# Patient Record
Sex: Female | Born: 1955 | Race: White | Hispanic: No | Marital: Married | Smoking: Former smoker
Health system: Southern US, Community
[De-identification: ages and names within clinical notes are randomized; demographics above are authoritative.]

## PROBLEM LIST (undated history)

## (undated) DIAGNOSIS — E785 Hyperlipidemia, unspecified: Secondary | ICD-10-CM

## (undated) DIAGNOSIS — Z87440 Personal history of urinary (tract) infections: Secondary | ICD-10-CM

## (undated) DIAGNOSIS — R011 Cardiac murmur, unspecified: Secondary | ICD-10-CM

## (undated) HISTORY — DX: Personal history of urinary (tract) infections: Z87.440

## (undated) HISTORY — DX: Hyperlipidemia, unspecified: E78.5

## (undated) HISTORY — DX: Cardiac murmur, unspecified: R01.1

---

## 2011-01-16 HISTORY — PX: CHOLECYSTECTOMY: SHX55

## 2011-09-19 ENCOUNTER — Ambulatory Visit: Payer: Self-pay

## 2011-10-11 ENCOUNTER — Ambulatory Visit: Payer: Self-pay | Admitting: Family Medicine

## 2011-10-22 ENCOUNTER — Ambulatory Visit: Payer: Self-pay | Admitting: Family Medicine

## 2011-11-19 ENCOUNTER — Ambulatory Visit: Payer: Self-pay | Admitting: Surgery

## 2011-11-19 DIAGNOSIS — R011 Cardiac murmur, unspecified: Secondary | ICD-10-CM

## 2011-11-22 ENCOUNTER — Ambulatory Visit: Payer: Self-pay | Admitting: Surgery

## 2012-01-16 HISTORY — PX: BREAST BIOPSY: SHX20

## 2012-04-03 ENCOUNTER — Ambulatory Visit: Payer: Self-pay

## 2012-04-23 ENCOUNTER — Ambulatory Visit (INDEPENDENT_AMBULATORY_CARE_PROVIDER_SITE_OTHER): Payer: PRIVATE HEALTH INSURANCE | Admitting: General Surgery

## 2012-04-23 ENCOUNTER — Encounter: Payer: Self-pay | Admitting: General Surgery

## 2012-04-23 VITALS — BP 124/62 | HR 88 | Resp 14 | Ht 60.0 in | Wt 105.0 lb

## 2012-04-23 DIAGNOSIS — R920 Mammographic microcalcification found on diagnostic imaging of breast: Secondary | ICD-10-CM

## 2012-04-23 NOTE — Patient Instructions (Addendum)
The stereotactic procedure was reviewed with the patient. The potential for bleeding, infection and pain was reviewed. At this time, the benefits outweigh the risk, and the patient is amenable to proceed.  Patient will be contacted to arrange left breast stereotactic biopsy.

## 2012-04-23 NOTE — Progress Notes (Signed)
Patient ID: Samantha Diaz, female   DOB: Jul 23, 1955, 57 y.o.   MRN: [id number]  Chief Complaint  Patient presents with  . Other    f/u mammo     HPI Samantha Diaz is a 57 y.o. female here today following up from an abnormal mammogram left breast done March 2014 previous mammogram was September 2013.  Referred by Dr Patton Salles. Patient denies any breast issues.  Denies any recent breast trauma. HPI  Past Medical History  Diagnosis Date  . Murmur     Past Surgical History  Procedure Laterality Date  . Cholecystectomy  2013    No family history on file.  Social History History  Substance Use Topics  . Smoking status: Former Smoker    Quit date: 01/16/2004  . Smokeless tobacco: Never Used  . Alcohol Use: No    Allergies  Allergen Reactions  . Sulfa Antibiotics Swelling    Current Outpatient Prescriptions  Medication Sig Dispense Refill  . fish oil-omega-3 fatty acids 1000 MG capsule Take 2 g by mouth daily.      . Multiple Vitamins-Calcium (VIACTIV MULTI-VITAMIN) CHEW Chew by mouth.      . Multiple Vitamins-Minerals (ECHINACEA ACZ PO) Take by mouth.       No current facility-administered medications for this visit.    Review of Systems Review of Systems  Constitutional: Negative.   Respiratory: Negative.   Cardiovascular: Negative.     Blood pressure 124/62, pulse 88, resp. rate 14, height 5' (1.524 m), weight 105 lb (47.628 kg).  Physical Exam Physical Exam  Constitutional: She is oriented to person, place, and time. She appears well-developed and well-nourished.  Cardiovascular: Normal rate and regular rhythm.   Murmur heard.  Systolic murmur is present with a grade of 2/6  Pulmonary/Chest: Effort normal and breath sounds normal. Right breast exhibits no inverted nipple, no mass, no nipple discharge, no skin change and no tenderness. Left breast exhibits no inverted nipple, no mass, no nipple discharge, no skin change and no tenderness. Breasts are  asymmetrical.  Lymphadenopathy:    She has no cervical adenopathy.    She has no axillary adenopathy.  Neurological: She is alert and oriented to person, place, and time.  Skin: Skin is warm and dry.  Right breast > left breast, approximate 1 cup size  Data Reviewed The patient originally underwent screening mammograms at [facility]. Supplemental images of the left breast were completed at [facility] on 09/19/2011. Faint microcalcifications were identified, possibly related to milk of calcium. Followup examination completed 04/03/2012 showed indeterminate calcifications in the medial aspect of the breast at the 9:00 position, better defined and past exams. Ultrasound was again completed because of hypoechoic nodules identified in the past. Multiple prominent ductal structures were identified. A 5 mm cystic lesion is noted the 5:00 position. BIRAD-4.   Assessment    Abnormal left breast mammogram.    Plan    The patient has undergone a 6 month follow up with increasingly prominent microcalcifications in the left breast. Stereotactic biopsy has been recommended to confirm a benign process. The procedure was reviewed in detail to             Samantha Diaz 04/24/2012, 4:33 PM

## 2012-04-24 ENCOUNTER — Encounter: Payer: Self-pay | Admitting: General Surgery

## 2012-04-25 ENCOUNTER — Telehealth: Payer: Self-pay | Admitting: *Deleted

## 2012-04-25 NOTE — Telephone Encounter (Signed)
Patient was contacted today to schedule a left breast stereotactic biopsy. This has been arranged for 05-05-12 at 2 pm. She is aware of date, time, and instructions.

## 2012-05-05 ENCOUNTER — Encounter: Payer: Self-pay | Admitting: *Deleted

## 2012-05-05 ENCOUNTER — Ambulatory Visit: Payer: Self-pay | Admitting: General Surgery

## 2012-05-05 DIAGNOSIS — R920 Mammographic microcalcification found on diagnostic imaging of breast: Secondary | ICD-10-CM

## 2012-05-07 ENCOUNTER — Encounter: Payer: Self-pay | Admitting: General Surgery

## 2012-05-07 ENCOUNTER — Telehealth: Payer: Self-pay | Admitting: *Deleted

## 2012-05-07 NOTE — Telephone Encounter (Signed)
Notified patient as instructed, patient pleased. Discussed follow-up appointments, patient agrees  

## 2012-05-07 NOTE — Telephone Encounter (Signed)
Message copied by Currie Paris on Wed May 07, 2012  8:32 AM ------      Message from: [place], Merrily Pew      Created: Tue May 06, 2012 10:35 PM       Mindi Junker: Please notify the patient the biopsy was benign.       Michelle: Arrange for f/u left mammogram and OV in six months.       Thanks. ------

## 2012-05-12 ENCOUNTER — Ambulatory Visit (INDEPENDENT_AMBULATORY_CARE_PROVIDER_SITE_OTHER): Payer: PRIVATE HEALTH INSURANCE | Admitting: *Deleted

## 2012-05-12 DIAGNOSIS — R920 Mammographic microcalcification found on diagnostic imaging of breast: Secondary | ICD-10-CM

## 2012-05-12 NOTE — Progress Notes (Signed)
Patient here today for follow up post left  breast stereotatic biopsy . Minimal bruising .  The patient is aware that a heating pad may be used for comfort as needed.  Aware of pathology. Follow up as scheduled.

## 2012-05-12 NOTE — Patient Instructions (Addendum)
Patient to return in 6 months.

## 2012-05-13 ENCOUNTER — Other Ambulatory Visit: Payer: Self-pay | Admitting: *Deleted

## 2012-05-13 DIAGNOSIS — R920 Mammographic microcalcification found on diagnostic imaging of breast: Secondary | ICD-10-CM

## 2012-05-13 NOTE — Progress Notes (Signed)
The patient has been asked to return to the office in six months for a unilateral left breast diagnostic mammogram.  

## 2012-05-28 ENCOUNTER — Encounter: Payer: Self-pay | Admitting: General Surgery

## 2012-10-10 ENCOUNTER — Ambulatory Visit: Payer: Self-pay | Admitting: General Surgery

## 2012-10-13 ENCOUNTER — Encounter: Payer: Self-pay | Admitting: General Surgery

## 2012-10-22 ENCOUNTER — Ambulatory Visit (INDEPENDENT_AMBULATORY_CARE_PROVIDER_SITE_OTHER): Payer: PRIVATE HEALTH INSURANCE | Admitting: General Surgery

## 2012-10-22 ENCOUNTER — Encounter: Payer: Self-pay | Admitting: General Surgery

## 2012-10-22 VITALS — BP 110/62 | HR 76 | Resp 14 | Ht 60.0 in | Wt 103.0 lb

## 2012-10-22 DIAGNOSIS — N6019 Diffuse cystic mastopathy of unspecified breast: Secondary | ICD-10-CM

## 2012-10-22 DIAGNOSIS — R920 Mammographic microcalcification found on diagnostic imaging of breast: Secondary | ICD-10-CM

## 2012-10-22 NOTE — Patient Instructions (Signed)
Patient to return as needed. 

## 2012-10-22 NOTE — Progress Notes (Addendum)
Patient ID: Samantha Diaz, female   DOB: 1955-06-25, 57 y.o.   MRN: [id number]  Chief Complaint  Patient presents with  . Follow-up    mammogram    HPI Samantha Diaz is a 57 y.o. female. who presents for a breast evaluation.   She underwent a stereotactic biopsy for microcalcifications on 05/05/2012. This showed no evidence of atypia or malignancy. Fibrocystic changes with duct ectasia including rupture as well as columnar cell changes with microcalcifications were noted.   The most recent mammogram was done on 10/10/12. Patient does perform regular self breast checks and gets regular mammograms done.  The patient denies any problems with her breasts at this time.    HPI  Past Medical History  Diagnosis Date  . Murmur     Past Surgical History  Procedure Laterality Date  . Cholecystectomy  2013    Family History  Problem Relation Age of Onset  . Cancer Father     skin    Social History History  Substance Use Topics  . Smoking status: Former Smoker    Quit date: 01/16/2004  . Smokeless tobacco: Never Used  . Alcohol Use: No    Allergies  Allergen Reactions  . Sulfa Antibiotics Swelling  . Latex Rash    Current Outpatient Prescriptions  Medication Sig Dispense Refill  . Cholecalciferol (VITAMIN D) 2000 UNITS tablet Take 2,000 Units by mouth daily.      . fish oil-omega-3 fatty acids 1000 MG capsule Take 2 g by mouth daily.      . Multiple Vitamins-Calcium (VIACTIV MULTI-VITAMIN) CHEW Chew by mouth.      . Multiple Vitamins-Minerals (ECHINACEA ACZ PO) Take by mouth.       No current facility-administered medications for this visit.    Review of Systems Review of Systems  Constitutional: Negative.   Respiratory: Negative.   Cardiovascular: Negative.     Blood pressure 110/62, pulse 76, resp. rate 14, height 5' (1.524 m), weight 103 lb (46.72 kg).  Physical Exam Physical Exam  Constitutional: She is oriented to person, place, and time. She appears  well-developed and well-nourished.  Neck: No thyromegaly present.  Cardiovascular: Normal rate, regular rhythm and normal heart sounds.   No murmur (While the patient has a history of a heart murmur, none is appreciated on today's exam.) heard. Pulmonary/Chest: Effort normal and breath sounds normal. Right breast exhibits no inverted nipple, no mass, no nipple discharge, no skin change and no tenderness. Left breast exhibits no inverted nipple, no mass, no nipple discharge, no skin change and no tenderness.  Lymphadenopathy:    She has no cervical adenopathy.    She has no axillary adenopathy.  Neurological: She is alert and oriented to person, place, and time.  Skin: Skin is warm and dry.    Data Reviewed Bilateral mammograms is 10/10/2012 were reviewed. Normal exam. BI-RAD-1.  Biopsy dated 05/05/2012 for microcalcifications was benign.  Assessment    Benign breast exam.    Plan    The patient will resume annual screening mammograms with her primary care provider in fall 2015.  She's encouraged to call should she appreciate any changes on her own breast exam.       Samantha Diaz 11/11/2012, 2:55 PM

## 2012-10-23 ENCOUNTER — Encounter: Payer: Self-pay | Admitting: General Surgery

## 2012-10-23 DIAGNOSIS — R920 Mammographic microcalcification found on diagnostic imaging of breast: Secondary | ICD-10-CM | POA: Insufficient documentation

## 2012-11-20 ENCOUNTER — Other Ambulatory Visit: Payer: Self-pay

## 2013-10-12 ENCOUNTER — Ambulatory Visit: Payer: Self-pay

## 2013-11-16 ENCOUNTER — Encounter: Payer: Self-pay | Admitting: General Surgery

## 2014-05-04 NOTE — Op Note (Signed)
PATIENT NAME:  Samantha Diaz, Samantha Diaz MR#:  [id number] DATE OF BIRTH:  [birth year]  DATE OF PROCEDURE:  11/22/2011  PREOPERATIVE DIAGNOSIS: Symptomatic cholelithiasis.  POSTOPERATIVE DIAGNOSIS:  Symptomatic cholelithiasis.  PROCEDURE PERFORMED: Laparoscopic cholecystectomy.   SURGEON: Samaa Ueda A. Danaye Sobh, M.D.   ANESTHESIA: General.   ESTIMATED BLOOD LOSS: 10 mL.  INDICATION FOR SURGERY: [name] is a pleasant 59 year old female with recurrent history of epigastric and right upper quadrant pain with fatty foods. She had gallstones on ultrasound. We brought her to the operating room for laparoscopic cholecystectomy.   DETAILS OF PROCEDURE: The patient was brought to the operating room suite. She was laid supine on the operating room table.  She was induced, endotracheal tube was placed and general anesthesia was administered. Her abdomen was prepped and draped in standard surgical fashion. A time out was then performed correctly identifying patient name, operative site, and procedure to be performed. A supraumbilical incision was made. This was deepened down to the fascia. The fascia was grasped. The fascia was incised. A finger was placed inside the fascial incision and there were no adhesions to the peritoneum. Two 0 Vicryl stay sutures were placed through the fascia. A Hassan trocar was placed into the abdomen. The abdomen was insufflated. Camera was placed into the abdomen. The gallbladder was visualized. An epigastric 11 mm trocar and two 5 mm trocars were then placed in the anterior axillary and midclavicular lines. The gallbladder was grasped with the lateral most trocar and reflected over the dome of the liver. The cystic duct and cystic artery were then dissected out. The critical view was obtained. Three clips were placed across the cystic duct and cystic artery. These were ligated. The gallbladder was then taken off the gallbladder fossa using hook cautery. The gallbladder was then taken  out through the supraumbilical port with an Endo Catch bag. The liver bed was then examined and made hemostatic using electrocautery. The abdomen was then irrigated. There was no obvious bleeding. The trocar sites were then closed. 0 Vicryl suture was used to close the fascia of the supraumbilical site. Port site skin was then closed using a combination of 4-0 Monocryl running subcuticular and deep dermal inverted sutures. Steri-Strips, Telfa gauze, and Tegaderm was then used to complete the dressing. The patient was then awoken, extubated, and brought to the postanesthesia care unit. There were no immediate complications. Needle, sponge, and instrument counts were correct at the end of the procedure.  ____________________________ Glena Norfolk. Larosa Rhines, MD cal:slb D: 11/22/2011 09:05:45 ET T: 11/22/2011 11:42:04 ET JOB#: [id number]  cc: Harrell Gave A. Annebelle Bostic, MD, <Dictator> Floyde Parkins MD ELECTRONICALLY SIGNED 11/25/2011 21:26

## 2014-08-31 ENCOUNTER — Encounter: Payer: Self-pay | Admitting: Primary Care

## 2014-08-31 ENCOUNTER — Ambulatory Visit (INDEPENDENT_AMBULATORY_CARE_PROVIDER_SITE_OTHER): Payer: PRIVATE HEALTH INSURANCE | Admitting: Primary Care

## 2014-08-31 VITALS — BP 116/76 | HR 71 | Temp 98.2°F | Ht 60.0 in | Wt 114.2 lb

## 2014-08-31 DIAGNOSIS — E7800 Pure hypercholesterolemia, unspecified: Secondary | ICD-10-CM | POA: Insufficient documentation

## 2014-08-31 DIAGNOSIS — R011 Cardiac murmur, unspecified: Secondary | ICD-10-CM

## 2014-08-31 MED ORDER — SIMVASTATIN 20 MG PO TABS: 20.0000 mg | ORAL_TABLET | Freq: Every day | ORAL | Status: DC

## 2014-08-31 NOTE — Assessment & Plan Note (Signed)
Endorses history of. No murmur noted on exam today. Asymptomatic Will continue to monitor

## 2014-08-31 NOTE — Assessment & Plan Note (Signed)
Diagnosed 3 years ago and is managed on Simvastatin. Fair diet, does not currently exercise. Refills of simvastatin provided today. Will recheck lipids at upcoming physical

## 2014-08-31 NOTE — Progress Notes (Signed)
Pre visit review using our clinic review tool, if applicable. No additional management support is needed unless otherwise documented below in the visit note. 

## 2014-08-31 NOTE — Patient Instructions (Addendum)
Refills have been sent in for your Simvastatin.  Please schedule a physical with me in the next 3 months. You will also schedule a lab only appointment one week prior. We will discuss your lab results during your physical. We will also do your pap that day.  It was a pleasure to meet you today! Please don't hesitate to call me with any questions. Welcome to [facility]!

## 2014-08-31 NOTE — Progress Notes (Signed)
Subjective:    Patient ID: Samantha Diaz, female    DOB: 09-25-1955, 59 y.o.   MRN: [id number]  HPI  Samantha Diaz is a 59 year old female who presents today to establish care and discuss the problems mentioned below. Will obtain old records.  1) Hyperlipidemia: Diagnosed in 2013. She is managed on simvastatin 20 mg, coenzyme q10. Last cholesterol check was in 2016 at OB/GYN office.  Endorses a fair diet which consists of: Breakfast: Belvita biscuit cookies, occasional doughnuts, coffee with cream and stevia. Lunch: Takes lunch. [place] wrap with cheese, carrots, mint cookies, goldfish Dinner: Limited fried foods. Red meat, chicken, working to incorporate more fish, rice, potato, maccoroni, vegetable. Dessert: Cookies, smaller containers of ice cream, candy bar Beverages: Drinks mostly water, caffeine free sweet tea, and pepsi.  Exercise: Does not currently exercise. Will walk 2 miles 4 days weekly during cooler weather.  2) Heart Murmur: Diagnosed 20 years ago. She had an echocardiogram years ago which she endorses was normal. Denies chest pain, shortness of breath.  Review of Systems  Constitutional: Negative for unexpected weight change.  HENT: Negative for rhinorrhea.   Respiratory: Negative for cough and shortness of breath.   Cardiovascular: Negative for chest pain.  Gastrointestinal: Negative for diarrhea and constipation.  Genitourinary: Negative for difficulty urinating.  Musculoskeletal: Negative for myalgias and arthralgias.  Skin: Negative for rash.       Has numerous moles that she would like removed.   Neurological: Negative for dizziness, numbness and headaches.  Psychiatric/Behavioral:       Denies concerns for anxiety or depression       Past Medical History  Diagnosis Date  . Murmur   . Hyperlipidemia   . History of UTI     Social History   Social History  . Marital Status: Married    Spouse Name: N/A  . Number of Children: N/A  . Years of  Education: N/A   Occupational History  . Not on file.   Social History Main Topics  . Smoking status: Former Smoker    Quit date: 01/16/2004  . Smokeless tobacco: Never Used  . Alcohol Use: 0.0 oz/week    0 Standard drinks or equivalent per week     Comment: rarely  . Drug Use: No  . Sexual Activity: Not on file   Other Topics Concern  . Not on file   Social History Narrative   Married.   No children.   Enjoys reading, traveling to the mountains.   Works in Therapist, art.    Past Surgical History  Procedure Laterality Date  . Cholecystectomy  2013  . Breast biopsy Left 2014    Family History  Problem Relation Age of Onset  . Cancer Father     skin  . Hyperlipidemia Father   . Hypertension Mother   . Cancer Maternal Grandfather     lung    Allergies  Allergen Reactions  . Sulfa Antibiotics Swelling  . Latex Rash    Current Outpatient Prescriptions on File Prior to Visit  Medication Sig Dispense Refill  . Cholecalciferol (VITAMIN D) 2000 UNITS tablet Take 2,000 Units by mouth daily.    . fish oil-omega-3 fatty acids 1000 MG capsule Take 2 g by mouth daily.    . Multiple Vitamins-Calcium (VIACTIV MULTI-VITAMIN) CHEW Chew by mouth.    . Multiple Vitamins-Minerals (ECHINACEA ACZ PO) Take by mouth.     No current facility-administered medications on file prior to visit.  BP 116/76 mmHg  Pulse 71  Temp(Src) 98.2 F (36.8 C) (Oral)  Ht 5' (1.524 m)  Wt 114 lb 3.2 oz (51.801 kg)  BMI 22.30 kg/m2  SpO2 99%    Objective:   Physical Exam  Constitutional: She is oriented to person, place, and time. She appears well-nourished.  Cardiovascular: Normal rate and regular rhythm.   Pulmonary/Chest: Effort normal and breath sounds normal.  Neurological: She is alert and oriented to person, place, and time.  Skin: Skin is warm and dry.  Psychiatric: She has a normal mood and affect.          Assessment & Plan:

## 2014-10-25 ENCOUNTER — Other Ambulatory Visit: Payer: Self-pay | Admitting: Primary Care

## 2014-10-25 DIAGNOSIS — Z1231 Encounter for screening mammogram for malignant neoplasm of breast: Secondary | ICD-10-CM

## 2014-11-15 ENCOUNTER — Ambulatory Visit
Admission: RE | Admit: 2014-11-15 | Discharge: 2014-11-15 | Disposition: A | Discharge status: Home / Self Care | Payer: PRIVATE HEALTH INSURANCE | Source: Ambulatory Visit | Attending: Primary Care | Admitting: Primary Care

## 2014-11-15 DIAGNOSIS — Z1231 Encounter for screening mammogram for malignant neoplasm of breast: Secondary | ICD-10-CM | POA: Insufficient documentation

## 2014-11-23 ENCOUNTER — Other Ambulatory Visit: Payer: Self-pay | Admitting: Primary Care

## 2014-11-23 DIAGNOSIS — E785 Hyperlipidemia, unspecified: Secondary | ICD-10-CM

## 2014-11-23 DIAGNOSIS — E559 Vitamin D deficiency, unspecified: Secondary | ICD-10-CM

## 2014-11-30 ENCOUNTER — Other Ambulatory Visit (INDEPENDENT_AMBULATORY_CARE_PROVIDER_SITE_OTHER): Payer: PRIVATE HEALTH INSURANCE

## 2014-11-30 DIAGNOSIS — E559 Vitamin D deficiency, unspecified: Secondary | ICD-10-CM

## 2014-11-30 DIAGNOSIS — E785 Hyperlipidemia, unspecified: Secondary | ICD-10-CM | POA: Diagnosis not present

## 2014-11-30 LAB — COMPREHENSIVE METABOLIC PANEL
ALBUMIN: 4.6 g/dL (ref 3.5–5.2)
ALT: 22 U/L (ref 0–35)
AST: 22 U/L (ref 0–37)
Alkaline Phosphatase: 94 U/L (ref 39–117)
BUN: 18 mg/dL (ref 6–23)
CHLORIDE: 104 meq/L (ref 96–112)
CO2: 28 meq/L (ref 19–32)
Calcium: 10.1 mg/dL (ref 8.4–10.5)
Creatinine, Ser: 0.73 mg/dL (ref 0.40–1.20)
GFR: 85.99 mL/min (ref >60.00)
Glucose, Bld: 100 mg/dL — ABNORMAL HIGH (ref 70–99)
POTASSIUM: 4 meq/L (ref 3.5–5.1)
SODIUM: 140 meq/L (ref 135–145)
Total Bilirubin: 0.5 mg/dL (ref 0.2–1.2)
Total Protein: 7.6 g/dL (ref 6.0–8.3)

## 2014-11-30 LAB — LIPID PANEL
CHOL/HDL RATIO: 4
Cholesterol: 200 mg/dL (ref 0–200)
HDL: 56.7 mg/dL (ref >39.00)
LDL Cholesterol: 120 mg/dL — ABNORMAL HIGH (ref 0–99)
NONHDL: 143.49
TRIGLYCERIDES: 115 mg/dL (ref 0.0–149.0)
VLDL: 23 mg/dL (ref 0.0–40.0)

## 2014-11-30 LAB — VITAMIN D 25 HYDROXY (VIT D DEFICIENCY, FRACTURES): VITD: 43.91 ng/mL (ref 30.00–100.00)

## 2014-12-07 ENCOUNTER — Ambulatory Visit (INDEPENDENT_AMBULATORY_CARE_PROVIDER_SITE_OTHER): Payer: PRIVATE HEALTH INSURANCE | Admitting: Primary Care

## 2014-12-07 ENCOUNTER — Encounter: Payer: Self-pay | Admitting: Primary Care

## 2014-12-07 VITALS — BP 116/74 | HR 69 | Temp 97.4°F | Ht 60.0 in | Wt 112.1 lb

## 2014-12-07 DIAGNOSIS — E7800 Pure hypercholesterolemia, unspecified: Secondary | ICD-10-CM | POA: Diagnosis not present

## 2014-12-07 DIAGNOSIS — Z0000 Encounter for general adult medical examination without abnormal findings: Secondary | ICD-10-CM

## 2014-12-07 DIAGNOSIS — D229 Melanocytic nevi, unspecified: Secondary | ICD-10-CM | POA: Diagnosis not present

## 2014-12-07 DIAGNOSIS — Z1211 Encounter for screening for malignant neoplasm of colon: Secondary | ICD-10-CM

## 2014-12-07 NOTE — Progress Notes (Signed)
Subjective:    Patient ID: Samantha Diaz, female    DOB: March 06, 1955, 59 y.o.   MRN: [id number]  HPI  Samantha Diaz is a 59 year old female who presents today for complete physical.  Immunizations: -Tetanus: Has not completed in numerous years. Declines today. -Influenza: Declines today. -Shingles: Has not completed. She's not sure if she's had the chicken pox.  Diet: Endorses a fair diet. Breakfast: Belvita snacks, oatmeal, instant grits Lunch: Wrap with [place] and cheese, goldfish crackers, 3 small cookies, fruit Dinner: Chicken, pork chops, occasionally beef. Limited vegetables. Bread. Dessert: Chocolate. Beverages: Water, coffee, pepsi Exercise: Walking 1-2 miles 4 days weekly. Active job. Eye exam: Completed 1 year ago. Dental exam: Completes semi-annually Colonoscopy: Never completed Dexa: Completed 1-2 years ago. Was recommended to take Fosamax, she did not want to take due to potential side effects. Pap Smear: Completed in 2013, negative. Patient requesting to have this completed next year. Mammogram: Completed in October 2016.   Review of Systems  Constitutional: Negative for unexpected weight change.  HENT: Negative for rhinorrhea.   Respiratory: Negative for cough and shortness of breath.   Cardiovascular: Negative for chest pain.  Gastrointestinal: Negative for diarrhea and constipation.  Genitourinary: Negative for difficulty urinating.  Musculoskeletal: Negative for myalgias and arthralgias.  Skin: Negative for rash.  Neurological: Negative for dizziness, numbness and headaches.  Psychiatric/Behavioral:       Denies concerns for anxiety or depression       Past Medical History  Diagnosis Date  . Murmur   . Hyperlipidemia   . History of UTI     Social History   Social History  . Marital Status: Married    Spouse Name: N/A  . Number of Children: N/A  . Years of Education: N/A   Occupational History  . Not on file.   Social History Main  Topics  . Smoking status: Former Smoker    Quit date: 01/16/2004  . Smokeless tobacco: Never Used  . Alcohol Use: 0.0 oz/week    0 Standard drinks or equivalent per week     Comment: rarely  . Drug Use: No  . Sexual Activity: Not on file   Other Topics Concern  . Not on file   Social History Narrative   Married.   No children.   Enjoys reading, traveling to the mountains.   Works in Therapist, art.    Past Surgical History  Procedure Laterality Date  . Cholecystectomy  2013  . Breast biopsy Left 2014    Family History  Problem Relation Age of Onset  . Cancer Father     skin  . Hyperlipidemia Father   . Hypertension Mother   . Cancer Maternal Grandfather     lung    Allergies  Allergen Reactions  . Sulfa Antibiotics Swelling  . Latex Rash    Current Outpatient Prescriptions on File Prior to Visit  Medication Sig Dispense Refill  . Cholecalciferol (VITAMIN D) 2000 UNITS tablet Take 2,000 Units by mouth daily.    . Coenzyme Q10 (COQ10) 100 MG CAPS Take 1 capsule by mouth daily.    Marland Kitchen EVENING PRIMROSE OIL PO Take 1,000 mg by mouth daily.    . simvastatin (ZOCOR) 20 MG tablet Take 1 tablet (20 mg total) by mouth daily. 30 tablet 5   No current facility-administered medications on file prior to visit.    BP 116/74 mmHg  Pulse 69  Temp(Src) 97.4 F (36.3 C) (Oral)  Ht 5' (1.524 m)  Wt 112 lb 1.9 oz (50.857 kg)  BMI 21.90 kg/m2  SpO2 97%    Objective:   Physical Exam  Constitutional: She is oriented to person, place, and time. She appears well-nourished.  HENT:  Right Ear: Tympanic membrane and ear canal normal.  Left Ear: Tympanic membrane and ear canal normal.  Mouth/Throat: Oropharynx is clear and moist.  Eyes: Conjunctivae and EOM are normal. Pupils are equal, round, and reactive to light.  Neck: Neck supple. No thyromegaly present.  Cardiovascular: Normal rate and regular rhythm.   Pulmonary/Chest: Effort normal and breath sounds normal.    Abdominal: Soft. Bowel sounds are normal. There is no tenderness.  Musculoskeletal: Normal range of motion.  Lymphadenopathy:    She has no cervical adenopathy.  Neurological: She is alert and oriented to person, place, and time. She has normal reflexes. No cranial nerve deficit.  Skin: Skin is warm and dry.  Multiple nevi to body. 3 cm circular dark nevus to left lateral trunk. 2 cm skin toned nevus to right lateral trunk.  Psychiatric: She has a normal mood and affect.          Assessment & Plan:

## 2014-12-07 NOTE — Assessment & Plan Note (Signed)
Improved since last lipid panel one year ago. See scanned document. Continue simvastatin 20 mg.

## 2014-12-07 NOTE — Assessment & Plan Note (Signed)
Tdap over due, declines today. Declines flu as well. Due for pap but patient would like to defer until next year. Mammogram completed last month and was normal. Dexa scan 1 year ago with decrease in bone density per patient. She was offered fosamax but declined. Start calcium with vitamin D today. 2 large nevi noted during exam, referral to derm for evaluation. Labs unremarkable. Discussed importance of healthy diet and exercise. Follow up in 1 year for repeat physical.

## 2014-12-07 NOTE — Progress Notes (Signed)
Pre visit review using our clinic review tool, if applicable. No additional management support is needed unless otherwise documented below in the visit note. 

## 2014-12-07 NOTE — Patient Instructions (Signed)
Stop by the lab to pick up your colon cancer screening kit.  Stop by the front and speak with Rosaria Ferries regarding your referral to dermatology.  Start taking Calcium 1200 mg tablets daily. This may be purchased over the counter.  Follow up in 1 year for repeat physical or sooner if needed.  It was a pleasure to see you today!

## 2014-12-21 ENCOUNTER — Other Ambulatory Visit (INDEPENDENT_AMBULATORY_CARE_PROVIDER_SITE_OTHER): Payer: PRIVATE HEALTH INSURANCE

## 2014-12-21 DIAGNOSIS — Z1211 Encounter for screening for malignant neoplasm of colon: Secondary | ICD-10-CM

## 2014-12-21 LAB — FECAL OCCULT BLOOD, IMMUNOCHEMICAL: FECAL OCCULT BLD: NEGATIVE

## 2015-03-17 ENCOUNTER — Other Ambulatory Visit: Payer: Self-pay | Admitting: Primary Care

## 2015-03-17 DIAGNOSIS — E785 Hyperlipidemia, unspecified: Secondary | ICD-10-CM

## 2015-03-17 NOTE — Telephone Encounter (Signed)
Electronically refill request for   simvastatin (ZOCOR) 20 MG tablet   Take 1 tablet (20 mg total) by mouth daily.  Dispense: 30 tablet   Refills: 5     Last prescribed on 08/31/2014. Last seen for CPE on 12/07/2014. No future appointment.

## 2015-05-04 ENCOUNTER — Telehealth: Payer: Self-pay | Admitting: Primary Care

## 2015-05-04 NOTE — Telephone Encounter (Signed)
Patient Name: Samantha Diaz DOB: July 20, 1955 Initial Comment Caller states had back pain in back, side and pelvic area last Thursday. Still having back pain Nurse Assessment Guidelines Guideline Title Affirmed Question Affirmed Notes Final Disposition User FINAL ATTEMPT MADE - no message left Beatty, RN, Lynda Comments 1028- Attempt made. This number is for a [employer]. No option to speak with someone to ask for caller. I asked lead Ashleigh to double check the number. Number correct per recording.

## 2015-05-04 NOTE — Telephone Encounter (Signed)
I spoke with pt and since 04/28/15 pt has had back pain; today more uncomfortable than true pain but pt is concerned about the pain she had initially which involved abd pain in pelvic area; pt has no hx of kidney stone but pt concerned could be kidney stone or appendicitis. Pt does not have burning, pain or frequency of urine and pt does not think she has a fever. No available appts at [facility] or [place]; pt did not want to go to [facility] for appt. Pt will decide and most likely go to [facility] in [place].

## 2015-05-04 NOTE — Telephone Encounter (Signed)
Noted. Will you please check on patient tomorrow? Did she every seek treatment?

## 2015-05-05 NOTE — Telephone Encounter (Signed)
Message left for patient to return my call.  

## 2015-05-06 NOTE — Telephone Encounter (Signed)
Noted  

## 2015-05-06 NOTE — Telephone Encounter (Signed)
Patient called back this morning. Patient stated that she went to urgent care and was given antibiotics for a UTI because there was some leukocytes. Patient is starting to feel better now. Will let us know if she needs anything.

## 2015-06-30 ENCOUNTER — Encounter: Payer: Self-pay | Admitting: Family Medicine

## 2015-06-30 ENCOUNTER — Ambulatory Visit (INDEPENDENT_AMBULATORY_CARE_PROVIDER_SITE_OTHER): Payer: PRIVATE HEALTH INSURANCE | Admitting: Family Medicine

## 2015-06-30 VITALS — BP 114/70 | HR 84 | Temp 97.8°F | Wt 116.5 lb

## 2015-06-30 DIAGNOSIS — W57XXXA Bitten or stung by nonvenomous insect and other nonvenomous arthropods, initial encounter: Secondary | ICD-10-CM | POA: Diagnosis not present

## 2015-06-30 DIAGNOSIS — S30861A Insect bite (nonvenomous) of abdominal wall, initial encounter: Secondary | ICD-10-CM | POA: Diagnosis not present

## 2015-06-30 MED ORDER — DOXYCYCLINE HYCLATE 100 MG PO TABS: 100.0000 mg | ORAL_TABLET | Freq: Two times a day (BID) | ORAL | Status: DC

## 2015-06-30 NOTE — Assessment & Plan Note (Signed)
Low risk for tick borne illness. No signs of cellulitis. Continue supportive care. WASP for doxy printed with indications of when to fill. Discussed tick bite prevention.

## 2015-06-30 NOTE — Patient Instructions (Signed)
I do think this is local skin reaction to tick bite. Treat with neosporin daily. I think it should continue to heal well. Watch for tick borne illness symptoms over the next week - fever, chill, abdominal pain, nausea, joint pains, new rash or headache - if this develops, start antibiotic prescription printed out today. Let us know if questions.  Tick Bite Information Ticks are insects that attach themselves to the skin and draw blood for food. There are various types of ticks. Common types include wood ticks and deer ticks. Most ticks live in shrubs and grassy areas. Ticks can climb onto your body when you make contact with leaves or grass where the tick is waiting. The most common places on the body for ticks to attach themselves are the scalp, neck, armpits, waist, and groin. Most tick bites are harmless, but sometimes ticks carry germs that cause diseases. These germs can be spread to a person during the tick's feeding process. The chance of a disease spreading through a tick bite depends on:   The type of tick.  Time of year.   How long the tick is attached.   Geographic location.  HOW CAN YOU PREVENT TICK BITES? Take these steps to help prevent tick bites when you are outdoors:  Wear protective clothing. Long sleeves and long pants are best.   Wear white clothes so you can see ticks more easily.  Tuck your pant legs into your socks.   If walking on a trail, stay in the middle of the trail to avoid brushing against bushes.  Avoid walking through areas with long grass.  Put insect repellent on all exposed skin and along boot tops, pant legs, and sleeve cuffs.   Check clothing, hair, and skin repeatedly and before going inside.   Brush off any ticks that are not attached.  Take a shower or bath as soon as possible after being outdoors.  WHAT IS THE PROPER WAY TO REMOVE A TICK? Ticks should be removed as soon as possible to help prevent diseases caused by tick  bites. 1. If latex gloves are available, put them on before trying to remove a tick.  2. Using fine-point tweezers, grasp the tick as close to the skin as possible. You may also use curved forceps or a tick removal tool. Grasp the tick as close to its head as possible. Avoid grasping the tick on its body. 3. Pull gently with steady upward pressure until the tick lets go. Do not twist the tick or jerk it suddenly. This may break off the tick's head or mouth parts. 4. Do not squeeze or crush the tick's body. This could force disease-carrying fluids from the tick into your body.  5. After the tick is removed, wash the bite area and your hands with soap and water or other disinfectant such as alcohol. 6. Apply a small amount of antiseptic cream or ointment to the bite site.  7. Wash and disinfect any instruments that were used.  Do not try to remove a tick by applying a hot match, petroleum jelly, or fingernail polish to the tick. These methods do not work and may increase the chances of disease being spread from the tick bite.  WHEN SHOULD YOU SEEK MEDICAL CARE? Contact your health care provider if you are unable to remove a tick from your skin or if a part of the tick breaks off and is stuck in the skin.  After a tick bite, you need to be aware of  signs and symptoms that could be related to diseases spread by ticks. Contact your health care provider if you develop any of the following in the days or weeks after the tick bite:  Unexplained fever.  Rash. A circular rash that appears days or weeks after the tick bite may indicate the possibility of Lyme disease. The rash may resemble a target with a bull's-eye and may occur at a different part of your body than the tick bite.  Redness and swelling in the area of the tick bite.   Tender, swollen lymph glands.   Diarrhea.   Weight loss.   Cough.   Fatigue.   Muscle, joint, or bone pain.   Abdominal pain.   Headache.    Lethargy or a change in your level of consciousness.  Difficulty walking or moving your legs.   Numbness in the legs.   Paralysis.  Shortness of breath.   Confusion.   Repeated vomiting.    This information is not intended to replace advice given to you by your health care provider. Make sure you discuss any questions you have with your health care provider.   Document Released: 12/30/1999 Document Revised: 01/22/2014 Document Reviewed: 06/11/2012 Elsevier Interactive Patient Education [employer].

## 2015-06-30 NOTE — Progress Notes (Signed)
Pre visit review using our clinic review tool, if applicable. No additional management support is needed unless otherwise documented below in the visit note. 

## 2015-06-30 NOTE — Progress Notes (Signed)
   BP 114/70 mmHg  Pulse 84  Temp(Src) 97.8 F (36.6 C) (Oral)  Wt 116 lb 8 oz (52.844 kg)   CC: tick bite  Subjective:    Patient ID: Samantha Diaz, female    DOB: 05-23-1955, 60 y.o.   MRN: [id number]  HPI: Samantha Diaz is a 60 y.o. female presenting on 06/30/2015 for Insect Bite   Tick bite over weekend, attached for about 1 day in navel. Removed, now noticing some draining and itching.   Yesterday felt chills but didn't check temperature.   No fevers, new rashes, new joint pains, headache, abd pain, nausea.   Relevant past medical, surgical, family and social history reviewed and updated as indicated. Interim medical history since our last visit reviewed. Allergies and medications reviewed and updated. Current Outpatient Prescriptions on File Prior to Visit  Medication Sig  . Cholecalciferol (VITAMIN D) 2000 UNITS tablet Take 2,000 Units by mouth daily.  . Coenzyme Q10 (COQ10) 100 MG CAPS Take 1 capsule by mouth daily.  . Echinacea 400 MG CAPS Take 1 capsule by mouth daily.  Marland Kitchen EVENING PRIMROSE OIL PO Take 1,000 mg by mouth daily.  . simvastatin (ZOCOR) 20 MG tablet TAKE 1 TABLET (20 MG TOTAL) BY MOUTH DAILY.   No current facility-administered medications on file prior to visit.    Review of Systems Per HPI unless specifically indicated in ROS section     Objective:    BP 114/70 mmHg  Pulse 84  Temp(Src) 97.8 F (36.6 C) (Oral)  Wt 116 lb 8 oz (52.844 kg)  Wt Readings from Last 3 Encounters:  06/30/15 116 lb 8 oz (52.844 kg)  12/07/14 112 lb 1.9 oz (50.857 kg)  08/31/14 114 lb 3.2 oz (51.801 kg)    Physical Exam  Constitutional: She appears well-developed and well-nourished. No distress.  Musculoskeletal: She exhibits no edema.  Skin: Skin is warm and dry. No rash noted. There is erythema.  Slight erythema with slight crusting inner R navel No bulls eye lesion or other skin rash  Psychiatric: She has a normal mood and affect.  Nursing note and  vitals reviewed.     Assessment & Plan:   Problem List Items Addressed This Visit    Tick bite of abdomen - Primary    Low risk for tick borne illness. No signs of cellulitis. Continue supportive care. WASP for doxy printed with indications of when to fill. Discussed tick bite prevention.           Follow up plan: Return if symptoms worsen or fail to improve.  Ria Bush, MD

## 2015-09-23 ENCOUNTER — Ambulatory Visit (INDEPENDENT_AMBULATORY_CARE_PROVIDER_SITE_OTHER): Payer: PRIVATE HEALTH INSURANCE | Admitting: Family Medicine

## 2015-09-23 VITALS — BP 120/74 | HR 86 | Temp 97.5°F | Ht 60.0 in | Wt 117.4 lb

## 2015-09-23 DIAGNOSIS — B029 Zoster without complications: Secondary | ICD-10-CM | POA: Diagnosis not present

## 2015-09-23 MED ORDER — VALACYCLOVIR HCL 1 G PO TABS: 1000.0000 mg | ORAL_TABLET | Freq: Three times a day (TID) | ORAL | 0 refills | Status: DC

## 2015-09-23 NOTE — Patient Instructions (Signed)

## 2015-09-23 NOTE — Progress Notes (Signed)
   Subjective:    Patient ID: Samantha Diaz, female    DOB: 08/19/1955, 60 y.o.   MRN: [id number]  HPI This is a 60 yo female who presents today with 3 days of tingling and burning of her right arm. She noticed red skin lesions on her right arm this morning. Also has noticed burning on right side of abdomen. Area very sensitive to touch. A little fatigue this week, no fever or chills, no myalgias. Thinks she had chicken pox as a child.   Past Medical History:  Diagnosis Date  . History of UTI   . Hyperlipidemia   . Murmur    Past Surgical History:  Procedure Laterality Date  . BREAST BIOPSY Left 2014  . CHOLECYSTECTOMY  2013   Family History  Problem Relation Age of Onset  . Cancer Father     skin  . Hyperlipidemia Father   . Hypertension Mother   . Cancer Maternal Grandfather     lung   Social History  Substance Use Topics  . Smoking status: Former Smoker    Quit date: 01/16/2004  . Smokeless tobacco: Never Used  . Alcohol use 0.0 oz/week     Comment: rarely      Review of Systems Per HPI    Objective:   Physical Exam  Constitutional: She is oriented to person, place, and time. She appears well-developed and well-nourished.  HENT:  Head: Normocephalic and atraumatic.  Eyes: Conjunctivae are normal.  Cardiovascular: Normal rate.   Pulmonary/Chest: Effort normal.  Neurological: She is alert and oriented to person, place, and time.  Skin: Skin is warm and dry. Rash (right elbow and right mid forearm wiht several scattered erythemaotous papules. Abdomen  without lesions. ) noted.  Psychiatric: She has a normal mood and affect. Her behavior is normal. Judgment and thought content normal.  Vitals reviewed.     BP 120/74   Pulse 86   Temp 97.5 F (36.4 C) (Oral)   Ht 5' (1.524 m)   Wt 117 lb 6.4 oz (53.3 kg)   SpO2 98%   BMI 22.93 kg/m  Wt Readings from Last 3 Encounters:  09/23/15 117 lb 6.4 oz (53.3 kg)  06/30/15 116 lb 8 oz (52.8 kg)  12/07/14 112  lb 1.9 oz (50.9 kg)       Assessment & Plan:  1. Shingles - symptoms suspicious for early shingles outbreak - Provided written and verbal information regarding diagnosis and treatment. - RTC precautions reviewed - valACYclovir (VALTREX) 1000 MG tablet; Take 1 tablet (1,000 mg total) by mouth 3 (three) times daily.  Dispense: 21 tablet; Refill: 0   Clarene Reamer, FNP-BC  [facility]  09/23/2015 5:36 PM

## 2015-09-23 NOTE — Progress Notes (Signed)
Pre visit review using our clinic review tool, if applicable. No additional management support is needed unless otherwise documented below in the visit note. 

## 2015-09-25 ENCOUNTER — Encounter: Payer: Self-pay | Admitting: Family Medicine

## 2015-10-05 ENCOUNTER — Other Ambulatory Visit: Payer: Self-pay | Admitting: Primary Care

## 2015-10-05 DIAGNOSIS — Z1231 Encounter for screening mammogram for malignant neoplasm of breast: Secondary | ICD-10-CM

## 2015-11-16 ENCOUNTER — Ambulatory Visit
Admission: RE | Admit: 2015-11-16 | Discharge: 2015-11-16 | Disposition: A | Discharge status: Home / Self Care | Payer: PRIVATE HEALTH INSURANCE | Source: Ambulatory Visit | Attending: Primary Care | Admitting: Primary Care

## 2015-11-16 DIAGNOSIS — Z1231 Encounter for screening mammogram for malignant neoplasm of breast: Secondary | ICD-10-CM

## 2015-12-04 ENCOUNTER — Other Ambulatory Visit: Payer: Self-pay | Admitting: Primary Care

## 2015-12-04 DIAGNOSIS — E559 Vitamin D deficiency, unspecified: Secondary | ICD-10-CM

## 2015-12-04 DIAGNOSIS — E785 Hyperlipidemia, unspecified: Secondary | ICD-10-CM

## 2015-12-05 ENCOUNTER — Other Ambulatory Visit (INDEPENDENT_AMBULATORY_CARE_PROVIDER_SITE_OTHER): Payer: PRIVATE HEALTH INSURANCE

## 2015-12-05 DIAGNOSIS — E559 Vitamin D deficiency, unspecified: Secondary | ICD-10-CM

## 2015-12-05 DIAGNOSIS — E785 Hyperlipidemia, unspecified: Secondary | ICD-10-CM | POA: Diagnosis not present

## 2015-12-05 LAB — COMPREHENSIVE METABOLIC PANEL
ALBUMIN: 4.5 g/dL (ref 3.5–5.2)
ALK PHOS: 81 U/L (ref 39–117)
ALT: 22 U/L (ref 0–35)
AST: 21 U/L (ref 0–37)
BUN: 18 mg/dL (ref 6–23)
CALCIUM: 9.6 mg/dL (ref 8.4–10.5)
CO2: 25 mEq/L (ref 19–32)
Chloride: 104 mEq/L (ref 96–112)
Creatinine, Ser: 0.9 mg/dL (ref 0.40–1.20)
GFR: 67.31 mL/min (ref >60.00)
Glucose, Bld: 101 mg/dL — ABNORMAL HIGH (ref 70–99)
POTASSIUM: 3.9 meq/L (ref 3.5–5.1)
SODIUM: 139 meq/L (ref 135–145)
TOTAL PROTEIN: 7.4 g/dL (ref 6.0–8.3)
Total Bilirubin: 0.6 mg/dL (ref 0.2–1.2)

## 2015-12-05 LAB — VITAMIN D 25 HYDROXY (VIT D DEFICIENCY, FRACTURES): VITD: 36.32 ng/mL (ref 30.00–100.00)

## 2015-12-05 LAB — LIPID PANEL
Cholesterol: 210 mg/dL — ABNORMAL HIGH (ref 0–200)
HDL: 63.6 mg/dL (ref >39.00)
LDL Cholesterol: 128 mg/dL — ABNORMAL HIGH (ref 0–99)
NonHDL: 145.95
Total CHOL/HDL Ratio: 3
Triglycerides: 91 mg/dL (ref 0.0–149.0)
VLDL: 18.2 mg/dL (ref 0.0–40.0)

## 2015-12-12 ENCOUNTER — Encounter: Payer: Self-pay | Admitting: Primary Care

## 2015-12-12 ENCOUNTER — Ambulatory Visit (INDEPENDENT_AMBULATORY_CARE_PROVIDER_SITE_OTHER): Payer: PRIVATE HEALTH INSURANCE | Admitting: Primary Care

## 2015-12-12 ENCOUNTER — Encounter: Payer: PRIVATE HEALTH INSURANCE | Admitting: Primary Care

## 2015-12-12 VITALS — BP 122/70 | HR 73 | Temp 98.1°F | Ht 60.0 in | Wt 117.0 lb

## 2015-12-12 DIAGNOSIS — Z23 Encounter for immunization: Secondary | ICD-10-CM

## 2015-12-12 DIAGNOSIS — M8580 Other specified disorders of bone density and structure, unspecified site: Secondary | ICD-10-CM

## 2015-12-12 DIAGNOSIS — Z0000 Encounter for general adult medical examination without abnormal findings: Secondary | ICD-10-CM

## 2015-12-12 DIAGNOSIS — E7800 Pure hypercholesterolemia, unspecified: Secondary | ICD-10-CM

## 2015-12-12 DIAGNOSIS — R920 Mammographic microcalcification found on diagnostic imaging of breast: Secondary | ICD-10-CM

## 2015-12-12 DIAGNOSIS — M810 Age-related osteoporosis without current pathological fracture: Secondary | ICD-10-CM | POA: Insufficient documentation

## 2015-12-12 MED ORDER — ZOSTER VACCINE LIVE 19400 UNT/0.65ML ~~LOC~~ SUSR: 0.6500 mL | Freq: Once | SUBCUTANEOUS | 0 refills | Status: AC

## 2015-12-12 NOTE — Assessment & Plan Note (Addendum)
Declines Td. Influenza provided today. Rx printed for Zostavax. Information provided regarding Cologuard for colon cancer screening. Mammogram UTD. Bone density pending. Pap due in 2018. Discussed the importance of a healthy diet and regular exercise in order for weight loss, and to reduce the risk of other medical diseases. Exam unremarkable. Labs overall stable.  Follow up in 1 year for annual exam.

## 2015-12-12 NOTE — Progress Notes (Signed)
Subjective:    Patient ID: Jeanella Craze, female    DOB: 1955/12/06, 60 y.o.   MRN: [id number]  HPI  Ms. Bernabei is a 60 year old female who presents today for complete physical.  Immunizations: -Tetanus: Believes it's been over 10 years ago.  -Influenza: Due today. -Shingles: Active shingles in 2017.   Diet: She endorses a fair diet. Breakfast: Belvita snacks Lunch: [place] wrap, chicken salad, pimento cheese Dinner: Fried chicken, hamburgers, restaurants Snacks: None Desserts: Chocolate and cookies daily Beverages: Soda, sweet tea, coffee, water  Exercise: She does not currently exercise Eye exam: Completed in 2017, changes in vision. Dental exam: Completes semi-annually Colonoscopy: Never completed, elects for Cologuard. Dexa: Due now. Pap Smear: Completed in 2013. Due in 2018. Mammogram: Completed in November 2017, normal.    Review of Systems  Constitutional: Negative for unexpected weight change.  HENT: Negative for rhinorrhea.   Respiratory: Negative for cough and shortness of breath.   Cardiovascular: Negative for chest pain.  Gastrointestinal: Negative for constipation and diarrhea.       Occasional constipation  Genitourinary: Negative for difficulty urinating and menstrual problem.  Musculoskeletal: Negative for arthralgias and myalgias.  Skin: Negative for rash.  Allergic/Immunologic: Negative for environmental allergies.  Neurological: Negative for dizziness, numbness and headaches.  Psychiatric/Behavioral:       Denies concerns for anxiety and depression       Past Medical History:  Diagnosis Date  . History of UTI   . Hyperlipidemia   . Murmur      Social History   Social History  . Marital status: Married    Spouse name: N/A  . Number of children: N/A  . Years of education: N/A   Occupational History  . Not on file.   Social History Main Topics  . Smoking status: Former Smoker    Quit date: 01/16/2004  . Smokeless tobacco: Never  Used  . Alcohol use 0.0 oz/week     Comment: rarely  . Drug use: No  . Sexual activity: Not on file   Other Topics Concern  . Not on file   Social History Narrative   Married.   No children.   Enjoys reading, traveling to the mountains.   Works in Therapist, art.    Past Surgical History:  Procedure Laterality Date  . BREAST BIOPSY Left 2014  . CHOLECYSTECTOMY  2013    Family History  Problem Relation Age of Onset  . Cancer Father     skin  . Hyperlipidemia Father   . Hypertension Mother   . Cancer Maternal Grandfather     lung  . Breast cancer Neg Hx     Allergies  Allergen Reactions  . Sulfa Antibiotics Swelling  . Latex Rash    Current Outpatient Prescriptions on File Prior to Visit  Medication Sig Dispense Refill  . Cholecalciferol (VITAMIN D) 2000 UNITS tablet Take 2,000 Units by mouth daily.    . Coenzyme Q10 (COQ10) 100 MG CAPS Take 1 capsule by mouth daily.    . Echinacea 400 MG CAPS Take 1 capsule by mouth daily.    Marland Kitchen EVENING PRIMROSE OIL PO Take 1,000 mg by mouth daily.    . simvastatin (ZOCOR) 20 MG tablet TAKE 1 TABLET (20 MG TOTAL) BY MOUTH DAILY. 90 tablet 2   No current facility-administered medications on file prior to visit.     BP 122/70   Pulse 73   Temp 98.1 F (36.7 C) (Oral)   Ht 5' (  1.524 m)   Wt 117 lb (53.1 kg)   SpO2 98%   BMI 22.85 kg/m    Objective:   Physical Exam  Constitutional: She is oriented to person, place, and time. She appears well-nourished.  HENT:  Right Ear: Tympanic membrane and ear canal normal.  Left Ear: Tympanic membrane and ear canal normal.  Nose: Nose normal.  Mouth/Throat: Oropharynx is clear and moist.  Eyes: Conjunctivae and EOM are normal. Pupils are equal, round, and reactive to light.  Neck: Neck supple. No thyromegaly present.  Cardiovascular: Normal rate and regular rhythm.   No murmur heard. Pulmonary/Chest: Effort normal and breath sounds normal. She has no rales.  Abdominal: Soft.  Bowel sounds are normal. There is no tenderness.  Musculoskeletal: Normal range of motion.  Lymphadenopathy:    She has no cervical adenopathy.  Neurological: She is alert and oriented to person, place, and time. She has normal reflexes. No cranial nerve deficit.  Skin: Skin is warm and dry. No rash noted.  Numerous nevi to entire body. Follows with dermatology.  Psychiatric: She has a normal mood and affect.          Assessment & Plan:

## 2015-12-12 NOTE — Assessment & Plan Note (Signed)
Mammogram completed in 11/2015, normal. Repeat in 1 year.

## 2015-12-12 NOTE — Assessment & Plan Note (Signed)
TC slightly above goal, continue simvastatin 20 mg. Discussed to work on diet and exercise. LFT's unremarkable.

## 2015-12-12 NOTE — Progress Notes (Signed)
Pre visit review using our clinic review tool, if applicable. No additional management support is needed unless otherwise documented below in the visit note. 

## 2015-12-12 NOTE — Assessment & Plan Note (Signed)
Bone density last completed 2 years ago. New test order placed. Previously recommended to take Fosamax for which she declined. Continue Vitamin D for now.

## 2015-12-12 NOTE — Patient Instructions (Addendum)
Take the shingles vaccination to your pharmacy for administration.  We will notify you of the results of Cologuard once received.  It's importance to improve your diet by reducing consumption of fast food, fried food, processed snack foods, sugary drinks. Increase consumption of fresh vegetables and fruits, whole grains, water.  Ensure you are drinking 64 ounces of water daily.  Start exercising. You should be getting 150 minutes of moderate intensity exercise weekly.  You will be contacted regarding your Bone Density testing. Please let us know if you have not heard back within one week.   Follow up in 1 year for your annual physical or sooner if needed.  It was a pleasure to see you today!

## 2015-12-22 ENCOUNTER — Telehealth: Payer: Self-pay

## 2015-12-22 ENCOUNTER — Encounter: Payer: Self-pay | Admitting: Primary Care

## 2015-12-22 ENCOUNTER — Ambulatory Visit: Payer: PRIVATE HEALTH INSURANCE

## 2015-12-22 NOTE — Telephone Encounter (Signed)
Pt request order for cologuard test; pt checked with ins co and it is covered; pt has ins coverage until 01/13/16. Pt request cb when to expect delivery of cologuard test to her home.

## 2015-12-23 NOTE — Telephone Encounter (Signed)
Faxed order to [employer] for the cologuard.  Per DPR, left detail message for patient regarding order has been sent.

## 2016-01-01 ENCOUNTER — Encounter: Payer: Self-pay | Admitting: Primary Care

## 2016-01-07 ENCOUNTER — Other Ambulatory Visit: Payer: Self-pay | Admitting: Primary Care

## 2016-01-07 DIAGNOSIS — E785 Hyperlipidemia, unspecified: Secondary | ICD-10-CM

## 2016-01-10 LAB — COLOGUARD: Cologuard: NEGATIVE

## 2016-05-21 ENCOUNTER — Ambulatory Visit (INDEPENDENT_AMBULATORY_CARE_PROVIDER_SITE_OTHER): Payer: BLUE CROSS/BLUE SHIELD | Admitting: Primary Care

## 2016-05-21 ENCOUNTER — Encounter: Payer: Self-pay | Admitting: Primary Care

## 2016-05-21 VITALS — BP 120/74 | HR 73 | Temp 97.4°F | Ht 60.0 in | Wt 119.8 lb

## 2016-05-21 DIAGNOSIS — R109 Unspecified abdominal pain: Secondary | ICD-10-CM | POA: Diagnosis not present

## 2016-05-21 LAB — POC URINALSYSI DIPSTICK (AUTOMATED)
BILIRUBIN UA: NEGATIVE
Blood, UA: NEGATIVE
Glucose, UA: NEGATIVE
Ketones, UA: NEGATIVE
LEUKOCYTES UA: NEGATIVE
NITRITE UA: NEGATIVE
PH UA: 6 (ref 5.0–8.0)
Protein, UA: NEGATIVE
Spec Grav, UA: >=1.03 — AB (ref 1.010–1.025)
Urobilinogen, UA: NEGATIVE E.U./dL — AB

## 2016-05-21 MED ORDER — TAMSULOSIN HCL 0.4 MG PO CAPS: 0.4000 mg | ORAL_CAPSULE | Freq: Every day | ORAL | 0 refills | Status: DC

## 2016-05-21 NOTE — Progress Notes (Signed)
Pre visit review using our clinic review tool, if applicable. No additional management support is needed unless otherwise documented below in the visit note. 

## 2016-05-21 NOTE — Patient Instructions (Signed)
Start tamsulosin (Flomax) tablets for likely kidney stone. Take 1 capsule by mouth once daily.  I will send off the kidney stone for further evaluation.  Please call me in 3-4 days if your pain returns and/or if you start feeling worse.  It was a pleasure to see you today!   Dietary Guidelines to Help Prevent Kidney Stones Kidney stones are deposits of minerals and salts that form inside your kidneys. Your risk of developing kidney stones may be greater depending on your diet, your lifestyle, the medicines you take, and whether you have certain medical conditions. Most people can reduce their chances of developing kidney stones by following the instructions below. Depending on your overall health and the type of kidney stones you tend to develop, your dietitian may give you more specific instructions. What are tips for following this plan? Reading food labels   Choose foods with "no salt added" or "low-salt" labels. Limit your sodium intake to less than 1500 mg per day.  Choose foods with calcium for each meal and snack. Try to eat about 300 mg of calcium at each meal. Foods that contain 200-500 mg of calcium per serving include:  8 oz (237 ml) of milk, fortified nondairy milk, and fortified fruit juice.  8 oz (237 ml) of kefir, yogurt, and soy yogurt.  4 oz (118 ml) of tofu.  1 oz of cheese.  1 cup (300 g) of dried figs.  1 cup (91 g) of cooked broccoli.  1-3 oz can of sardines or mackerel.  Most people need 1000 to 1500 mg of calcium each day. Talk to your dietitian about how much calcium is recommended for you. Shopping   Buy plenty of fresh fruits and vegetables. Most people do not need to avoid fruits and vegetables, even if they contain nutrients that may contribute to kidney stones.  When shopping for convenience foods, choose:  Whole pieces of fruit.  Premade salads with dressing on the side.  Low-fat fruit and yogurt smoothies.  Avoid buying frozen meals or  prepared deli foods.  Look for foods with live cultures, such as yogurt and kefir. Cooking   Do not add salt to food when cooking. Place a salt shaker on the table and allow each person to add his or her own salt to taste.  Use vegetable protein, such as beans, textured vegetable protein (TVP), or tofu instead of meat in pasta, casseroles, and soups. Meal planning   Eat less salt, if told by your dietitian. To do this:  Avoid eating processed or premade food.  Avoid eating fast food.  Eat less animal protein, including cheese, meat, poultry, or fish, if told by your dietitian. To do this:  Limit the number of times you have meat, poultry, fish, or cheese each week. Eat a diet free of meat at least 2 days a week.  Eat only one serving each day of meat, poultry, fish, or seafood.  When you prepare animal protein, cut pieces into small portion sizes. For most meat and fish, one serving is about the size of one deck of cards.  Eat at least 5 servings of fresh fruits and vegetables each day. To do this:  Keep fruits and vegetables on hand for snacks.  Eat 1 piece of fruit or a handful of berries with breakfast.  Have a salad and fruit at lunch.  Have two kinds of vegetables at dinner.  Limit foods that are high in a substance called oxalate. These include:  Spinach.  Rhubarb.  Beets.  Potato chips and french fries.  Nuts.  If you regularly take a diuretic medicine, make sure to eat at least 1-2 fruits or vegetables high in potassium each day. These include:  Avocado.  Banana.  Orange, prune, carrot, or tomato juice.  Baked potato.  Cabbage.  Beans and split peas. General instructions   Drink enough fluid to keep your urine clear or pale yellow. This is the most important thing you can do.  Talk to your health care provider and dietitian about taking daily supplements. Depending on your health and the cause of your kidney stones, you may be advised:  Not to  take supplements with vitamin C.  To take a calcium supplement.  To take a daily probiotic supplement.  To take other supplements such as magnesium, fish oil, or vitamin B6.  Take all medicines and supplements as told by your health care provider.  Limit alcohol intake to no more than 1 drink a day for nonpregnant women and 2 drinks a day for men. One drink equals 12 oz of beer, 5 oz of wine, or 1 oz of hard liquor.  Lose weight if told by your health care provider. Work with your dietitian to find strategies and an eating plan that works best for you. What foods are not recommended? Limit your intake of the following foods, or as told by your dietitian. Talk to your dietitian about specific foods you should avoid based on the type of kidney stones and your overall health. Grains  Breads. Bagels. Rolls. Baked goods. Salted crackers. Cereal. Pasta. Vegetables  Spinach. Rhubarb. Beets. Canned vegetables. Angie Fava. Olives. Meats and other protein foods  Nuts. Nut butters. Large portions of meat, poultry, or fish. Salted or cured meats. Deli meats. Hot dogs. Sausages. Dairy  Cheese. Beverages  Regular soft drinks. Regular vegetable juice. Seasonings and other foods  Seasoning blends with salt. Salad dressings. Canned soups. Soy sauce. Ketchup. Barbecue sauce. Canned pasta sauce. Casseroles. Pizza. Lasagna. Frozen meals. Potato chips. [place] fries. Summary  You can reduce your risk of kidney stones by making changes to your diet.  The most important thing you can do is drink enough fluid. You should drink enough fluid to keep your urine clear or pale yellow.  Ask your health care provider or dietitian how much protein from animal sources you should eat each day, and also how much salt and calcium you should have each day. This information is not intended to replace advice given to you by your health care provider. Make sure you discuss any questions you have with your health care  provider. Document Released: 04/28/2010 Document Revised: 12/13/2015 Document Reviewed: 12/13/2015 Elsevier Interactive Patient Education  2017 [employer].

## 2016-05-21 NOTE — Progress Notes (Signed)
Subjective:    Patient ID: Samantha Diaz, female    DOB: 1955-12-16, 61 y.o.   MRN: [id number]  HPI  Samantha Diaz is a 60 year old female who presents today with a chief complaint of low back/flank pain. [facility] with complaints of flank pain. She was provided with a prescription for Macrobid antibiotics for presumed UTI, received a call two days later from the [facility] ,and was told to stop taking the antibiotic as her culture was negative.   Two days later she experienced significant pain to her groin which lasted for 1 day, improved the next days. Three nights ago she got up to use the bathroom, she dribbled slightly. She then returned to the bathroom later and urinated quite a bit "like a faucet was running on high". Since Friday she's experienced right flank pain with radiation around to right groin, but is overall feeling better.   She thinks she may have captured a stone from her episode of heavy urination on Friday and brought this with her to the clinic. She's also feeling fatigued. She denies hematuria, fevers, vomiting. She's been taking Aleve with improvement. Overall she's feeling better.  Review of Systems  Constitutional: Positive for fatigue. Negative for fever.  Gastrointestinal: Negative for abdominal pain, nausea and vomiting.  Genitourinary: Positive for flank pain. Negative for dysuria, frequency and vaginal discharge.       Groin pain       Past Medical History:  Diagnosis Date  . History of UTI   . Hyperlipidemia   . Murmur      Social History   Social History  . Marital status: Married    Spouse name: N/A  . Number of children: N/A  . Years of education: N/A   Occupational History  . Not on file.   Social History Main Topics  . Smoking status: Former Smoker    Quit date: 01/16/2004  . Smokeless tobacco: Never Used  . Alcohol use 0.0 oz/week     Comment: rarely  . Drug use: No  . Sexual activity: Not  on file   Other Topics Concern  . Not on file   Social History Narrative   Married.   No children.   Enjoys reading, traveling to the mountains.   Works in Therapist, art.    Past Surgical History:  Procedure Laterality Date  . BREAST BIOPSY Left 2014  . CHOLECYSTECTOMY  2013    Family History  Problem Relation Age of Onset  . Cancer Father     skin  . Hyperlipidemia Father   . Hypertension Mother   . Cancer Maternal Grandfather     lung  . Breast cancer Neg Hx     Allergies  Allergen Reactions  . Sulfa Antibiotics Swelling  . Latex Rash    Current Outpatient Prescriptions on File Prior to Visit  Medication Sig Dispense Refill  . Cholecalciferol (VITAMIN D) 2000 UNITS tablet Take 2,000 Units by mouth daily.    . Coenzyme Q10 (COQ10) 100 MG CAPS Take 1 capsule by mouth daily.    . Echinacea 400 MG CAPS Take 1 capsule by mouth daily.    Marland Kitchen EVENING PRIMROSE OIL PO Take 1,000 mg by mouth daily.    . simvastatin (ZOCOR) 20 MG tablet TAKE 1 TABLET (20 MG TOTAL) BY MOUTH DAILY. 90 tablet 1   No current facility-administered medications on file prior to visit.     BP  120/74   Pulse 73   Temp 97.4 F (36.3 C) (Oral)   Ht 5' (1.524 m)   Wt 119 lb 12.8 oz (54.3 kg)   SpO2 99%   BMI 23.40 kg/m    Objective:   Physical Exam  Constitutional: She appears well-nourished. She does not appear ill.  Neck: Neck supple.  Cardiovascular: Normal rate and regular rhythm.   Pulmonary/Chest: Effort normal and breath sounds normal.  Abdominal: Soft. Normal appearance. There is no tenderness. There is no CVA tenderness.  Skin: Skin is warm and dry.          Assessment & Plan:  Kidney Stone:  Flank pain since mid April 2018, worse three days ago with expulsion of 1 cm renal stone. Very odd appearance.  Exam today without CVA tenderness, she does not appear sickly/infectious and is in no distress today. UA: Negative for blood, leuks, nitrites.  Rx for Flomax capsules  sent to pharmacy for potentially retained stones.  Will send off stone for evaluation. Discussed to call if she develops fevers, increased pain, nausea/vomiting, hematuria, etc. If symptoms return consider CT renal study. No suspiciou Push intake of water, dietary handout provided to prevent renal stone formation.   Sheral Flow, NP

## 2016-05-25 LAB — STONE ANALYSIS: Stone Weight KSTONE: 0.204 g

## 2016-05-28 ENCOUNTER — Encounter: Payer: Self-pay | Admitting: Primary Care

## 2016-07-08 ENCOUNTER — Telehealth: Payer: Self-pay | Admitting: Primary Care

## 2016-07-08 DIAGNOSIS — R109 Unspecified abdominal pain: Secondary | ICD-10-CM

## 2016-07-09 NOTE — Telephone Encounter (Signed)
Is she experiencing any symptoms that would warrant a refill of Flomax? Any difficulty urinating, hematuria, flank pain? Or is this an auto refill from her pharmacy?

## 2016-07-09 NOTE — Telephone Encounter (Signed)
Ok to refill? Electronically refill request for tamsulosin (FLOMAX) 0.4 MG CAPS capsule.  Last prescribed and seen on 05/21/2016.

## 2016-07-10 NOTE — Telephone Encounter (Signed)
Per DPR, left detail message of Kate's comments for patient to call back. 

## 2016-07-11 NOTE — Telephone Encounter (Signed)
Pt called she does not a refill for flomax.  She thought the pharmacy was calling about simvastatin.  She states she does not need refills for any medications.

## 2016-07-11 NOTE — Telephone Encounter (Signed)
Noted. Will cancel request.

## 2016-07-14 ENCOUNTER — Other Ambulatory Visit: Payer: Self-pay | Admitting: Primary Care

## 2016-07-14 DIAGNOSIS — R109 Unspecified abdominal pain: Secondary | ICD-10-CM

## 2016-08-14 ENCOUNTER — Other Ambulatory Visit: Payer: Self-pay | Admitting: Primary Care

## 2016-08-14 DIAGNOSIS — E785 Hyperlipidemia, unspecified: Secondary | ICD-10-CM

## 2016-10-17 ENCOUNTER — Other Ambulatory Visit: Payer: Self-pay | Admitting: Primary Care

## 2016-10-17 DIAGNOSIS — Z1231 Encounter for screening mammogram for malignant neoplasm of breast: Secondary | ICD-10-CM

## 2016-11-19 ENCOUNTER — Ambulatory Visit
Admission: RE | Admit: 2016-11-19 | Discharge: 2016-11-19 | Disposition: A | Discharge status: Home / Self Care | Payer: BLUE CROSS/BLUE SHIELD | Source: Ambulatory Visit | Attending: Primary Care | Admitting: Primary Care

## 2016-11-19 DIAGNOSIS — Z1231 Encounter for screening mammogram for malignant neoplasm of breast: Secondary | ICD-10-CM | POA: Diagnosis not present

## 2016-11-29 ENCOUNTER — Other Ambulatory Visit: Payer: Self-pay | Admitting: Primary Care

## 2016-11-29 DIAGNOSIS — E7800 Pure hypercholesterolemia, unspecified: Secondary | ICD-10-CM

## 2016-12-05 ENCOUNTER — Other Ambulatory Visit (INDEPENDENT_AMBULATORY_CARE_PROVIDER_SITE_OTHER): Payer: BLUE CROSS/BLUE SHIELD

## 2016-12-05 DIAGNOSIS — E7800 Pure hypercholesterolemia, unspecified: Secondary | ICD-10-CM | POA: Diagnosis not present

## 2016-12-05 LAB — COMPREHENSIVE METABOLIC PANEL
ALBUMIN: 4.5 g/dL (ref 3.5–5.2)
ALT: 33 U/L (ref 0–35)
AST: 28 U/L (ref 0–37)
Alkaline Phosphatase: 81 U/L (ref 39–117)
BUN: 16 mg/dL (ref 6–23)
CALCIUM: 10.2 mg/dL (ref 8.4–10.5)
CHLORIDE: 105 meq/L (ref 96–112)
CO2: 29 meq/L (ref 19–32)
CREATININE: 0.66 mg/dL (ref 0.40–1.20)
GFR: 95.97 mL/min (ref >60.00)
Glucose, Bld: 104 mg/dL — ABNORMAL HIGH (ref 70–99)
POTASSIUM: 5 meq/L (ref 3.5–5.1)
Sodium: 140 mEq/L (ref 135–145)
Total Bilirubin: 0.7 mg/dL (ref 0.2–1.2)
Total Protein: 7.1 g/dL (ref 6.0–8.3)

## 2016-12-05 LAB — LIPID PANEL
CHOLESTEROL: 177 mg/dL (ref 0–200)
HDL: 59 mg/dL (ref >39.00)
LDL CALC: 98 mg/dL (ref 0–99)
NonHDL: 118.38
TRIGLYCERIDES: 100 mg/dL (ref 0.0–149.0)
Total CHOL/HDL Ratio: 3
VLDL: 20 mg/dL (ref 0.0–40.0)

## 2016-12-12 ENCOUNTER — Ambulatory Visit (INDEPENDENT_AMBULATORY_CARE_PROVIDER_SITE_OTHER): Payer: BLUE CROSS/BLUE SHIELD | Admitting: Primary Care

## 2016-12-12 ENCOUNTER — Encounter: Payer: Self-pay | Admitting: Primary Care

## 2016-12-12 VITALS — BP 120/76 | HR 95 | Temp 98.0°F | Ht 60.0 in | Wt 100.0 lb

## 2016-12-12 DIAGNOSIS — E7800 Pure hypercholesterolemia, unspecified: Secondary | ICD-10-CM

## 2016-12-12 DIAGNOSIS — F419 Anxiety disorder, unspecified: Secondary | ICD-10-CM

## 2016-12-12 DIAGNOSIS — F329 Major depressive disorder, single episode, unspecified: Secondary | ICD-10-CM

## 2016-12-12 DIAGNOSIS — F32A Depression, unspecified: Secondary | ICD-10-CM | POA: Insufficient documentation

## 2016-12-12 DIAGNOSIS — Z0000 Encounter for general adult medical examination without abnormal findings: Secondary | ICD-10-CM

## 2016-12-12 DIAGNOSIS — M810 Age-related osteoporosis without current pathological fracture: Secondary | ICD-10-CM | POA: Diagnosis not present

## 2016-12-12 DIAGNOSIS — Z23 Encounter for immunization: Secondary | ICD-10-CM

## 2016-12-12 MED ORDER — SERTRALINE HCL 25 MG PO TABS: 25.0000 mg | ORAL_TABLET | Freq: Every day | ORAL | 1 refills | Status: DC

## 2016-12-12 MED ORDER — TETANUS-DIPHTH-ACELL PERTUSSIS 5-2-15.5 LF-MCG/0.5 IM SUSP: 0.5000 mL | Freq: Once | INTRAMUSCULAR | 0 refills | Status: AC

## 2016-12-12 NOTE — Assessment & Plan Note (Signed)
Recent lipid panel unremarkable. Denies myalgias on Simvastatin, continue same.

## 2016-12-12 NOTE — Assessment & Plan Note (Signed)
Td due, Rx provided today. Other immunizations UTD. Bone density UTD. Mammogram UTD, Pap smear due, she declines. Recommended to work on diet, continue regular exercise for bone health. Exam unremarkable. Labs stable. Follow up in 1 year.

## 2016-12-12 NOTE — Assessment & Plan Note (Signed)
Depression, crying spells, anger spells x 4-5 months. PHQ 9 score of 11 and GAD 7 score of 9 today. Discussed several options for treatment, she elects for medication.  Rx for Zoloft sent to pharmacy. Patient is to take 1/2 tablet daily for 8 days, then advance to 1 full tablet thereafter. We discussed possible side effects of headache, GI upset, drowsiness, and SI/HI. If thoughts of SI/HI develop, we discussed to present to the emergency immediately. Patient verbalized understanding.   Follow up in 6 weeks.

## 2016-12-12 NOTE — Patient Instructions (Addendum)
Start sertraline (Zoloft) 25 mg tablets for anxiety and depression. Take 1 tablet by mouth once daily.  Continue walking for exercise.  Increase vegetables, fruit, whole grains, lean protein.  Ensure you are consuming 64 ounces of water daily.  Follow up in 6 weeks for re-evaluation.  It was a pleasure to see you today!

## 2016-12-12 NOTE — Progress Notes (Signed)
Subjective:    Patient ID: Samantha Diaz, female    DOB: [birth year], 61 y.o.   MRN: [id number]  HPI  Samantha Diaz is a 61 year old female who presents today for complete physical.  Immunizations: -Tetanus: Due -Influenza: Completed in October 2018 -Shingles: Completed in 2017  Diet: She endorses a fair diet. Breakfast: Belivita crackers  Lunch: Wrap, veggies, yogurt, fruit, cookies Dinner: Meat, vegetables  Snacks: Chocolate, yogurt Desserts: Daily Beverages: Water, coffee, occasional soda  Exercise: She is walking several miles 4 days a week Eye exam: Completed over one year ago. Dental exam: Completes semi-annually Colonoscopy: Completed Cologuard in 2017, negative Dexa: Completed, osteoporosis.  Pap Smear: Due, declines.  Mammogram: Completed in 2018   Review of Systems  Constitutional: Positive for appetite change. Negative for unexpected weight change.  HENT: Negative for rhinorrhea.   Respiratory: Negative for cough and shortness of breath.   Cardiovascular: Negative for chest pain.  Gastrointestinal: Negative for constipation and diarrhea.  Genitourinary: Negative for difficulty urinating and menstrual problem.  Musculoskeletal: Negative for arthralgias and myalgias.  Skin: Negative for rash.  Allergic/Immunologic: Negative for environmental allergies.  Neurological: Negative for dizziness, numbness and headaches.  Psychiatric/Behavioral:       Increased personal stress. Experiences crying spells, anger spells, decrease in appetite; several times weekly, symptoms since June 2018  PHQ 9 score of 11 and GAD 7 score 9 today.       Past Medical History:  Diagnosis Date  . History of UTI   . Hyperlipidemia   . Murmur      Social History   Socioeconomic History  . Marital status: Married    Spouse name: Not on file  . Number of children: Not on file  . Years of education: Not on file  . Highest education level: Not on file  Social Needs  .  Financial resource strain: Not on file  . Food insecurity - worry: Not on file  . Food insecurity - inability: Not on file  . Transportation needs - medical: Not on file  . Transportation needs - non-medical: Not on file  Occupational History  . Not on file  Tobacco Use  . Smoking status: Former Smoker    Last attempt to quit: 01/16/2004    Years since quitting: 12.9  . Smokeless tobacco: Never Used  Substance and Sexual Activity  . Alcohol use: Yes    Alcohol/week: 0.0 oz    Comment: rarely  . Drug use: No  . Sexual activity: Not on file  Other Topics Concern  . Not on file  Social History Narrative   Married.   No children.   Enjoys reading, traveling to the mountains.   Works in Therapist, art.    Past Surgical History:  Procedure Laterality Date  . BREAST BIOPSY Left 2014   neg  . CHOLECYSTECTOMY  2013    Family History  Problem Relation Age of Onset  . Cancer Father        skin  . Hyperlipidemia Father   . Hypertension Mother   . Cancer Maternal Grandfather        lung  . Breast cancer Neg Hx     Allergies  Allergen Reactions  . Sulfa Antibiotics Swelling    Tongue swelling  . Latex Rash    Current Outpatient Medications on File Prior to Visit  Medication Sig Dispense Refill  . Cholecalciferol (VITAMIN D) 2000 UNITS tablet Take 2,000 Units by mouth daily.    Marland Kitchen  Coenzyme Q10 (COQ10) 100 MG CAPS Take 1 capsule by mouth daily.    . Echinacea 400 MG CAPS Take 1 capsule by mouth daily.    Marland Kitchen EVENING PRIMROSE OIL PO Take 1,000 mg by mouth daily.    . simvastatin (ZOCOR) 20 MG tablet TAKE 1 TABLET (20 MG TOTAL) BY MOUTH DAILY. 90 tablet 1   No current facility-administered medications on file prior to visit.     BP 120/76   Pulse 95   Temp 98 F (36.7 C) (Oral)   Ht 5' (1.524 m)   Wt 100 lb (45.4 kg)   SpO2 99%   BMI 19.53 kg/m    Objective:   Physical Exam  Constitutional: She is oriented to person, place, and time. She appears well-nourished.    HENT:  Right Ear: Tympanic membrane and ear canal normal.  Left Ear: Tympanic membrane and ear canal normal.  Nose: Nose normal.  Mouth/Throat: Oropharynx is clear and moist.  Eyes: Conjunctivae and EOM are normal. Pupils are equal, round, and reactive to light.  Neck: Neck supple. No thyromegaly present.  Cardiovascular: Normal rate and regular rhythm.  Pulmonary/Chest: Effort normal and breath sounds normal. She has no rales.  Abdominal: Soft. Bowel sounds are normal. There is no tenderness.  Musculoskeletal: Normal range of motion.  Lymphadenopathy:    She has no cervical adenopathy.  Neurological: She is alert and oriented to person, place, and time. She has normal reflexes. No cranial nerve deficit.  Skin: Skin is warm and dry. No rash noted.  Psychiatric: She has a normal mood and affect.  Intermittent tearfulness           Assessment & Plan:

## 2016-12-12 NOTE — Assessment & Plan Note (Signed)
Bone density scan in 2017 with changes from osteopenia to osteoporosis. She declines calcium with vitamin D and any Rx treatment. Will repeat bone density scan in 1 year.

## 2016-12-27 ENCOUNTER — Encounter: Payer: Self-pay | Admitting: Primary Care

## 2017-01-23 ENCOUNTER — Encounter: Payer: Self-pay | Admitting: Primary Care

## 2017-01-23 ENCOUNTER — Ambulatory Visit (INDEPENDENT_AMBULATORY_CARE_PROVIDER_SITE_OTHER): Payer: BLUE CROSS/BLUE SHIELD | Admitting: Primary Care

## 2017-01-23 DIAGNOSIS — F329 Major depressive disorder, single episode, unspecified: Secondary | ICD-10-CM | POA: Diagnosis not present

## 2017-01-23 DIAGNOSIS — F32A Depression, unspecified: Secondary | ICD-10-CM

## 2017-01-23 DIAGNOSIS — F419 Anxiety disorder, unspecified: Secondary | ICD-10-CM

## 2017-01-23 MED ORDER — SERTRALINE HCL 25 MG PO TABS: 25.0000 mg | ORAL_TABLET | Freq: Every day | ORAL | 0 refills | Status: DC

## 2017-01-23 NOTE — Patient Instructions (Signed)
Continue Zoloft 25 mg tablets.   Please notify me if you need anything! It was a pleasure to see you today!

## 2017-01-23 NOTE — Assessment & Plan Note (Signed)
Improved on Zoloft. Will continue 12.5 mg dose at this time. Refills sent to pharmacy.

## 2017-01-23 NOTE — Progress Notes (Signed)
Subjective:    Patient ID: Samantha Diaz, female    DOB: [birth year], 62 y.o.   MRN: [id number]  HPI  Samantha Diaz is a 62 year old female who presents today for follow up of anxiety and depression.   She was last evaluated on 12/12/16 for a CPE. During her physical she endorsed increased stress from marital problems which were causing crying spells, irritability, decrease in appetite. These symptoms had been present since June 2018. We discussed various treatment options and she elected to start Zoloft 25 mg.  Since her last visit she's feeling better. Positive effects of Zoloft include decreased mood swings with less crying spells, she's noticed a significant decrease in irritability, and is feeling more positive. She is currently taking the 12.5 mg dose as she started to feel "groggy" and GI upset on the 25 mg dose. She denies suicidal thoughts, GI upset.  Review of Systems  Respiratory: Negative for shortness of breath.   Cardiovascular: Negative for chest pain.  Gastrointestinal: Negative for abdominal pain and diarrhea.  Psychiatric/Behavioral: Negative for suicidal ideas.       See HPI       Past Medical History:  Diagnosis Date  . History of UTI   . Hyperlipidemia   . Murmur      Social History   Socioeconomic History  . Marital status: Married    Spouse name: Not on file  . Number of children: Not on file  . Years of education: Not on file  . Highest education level: Not on file  Social Needs  . Financial resource strain: Not on file  . Food insecurity - worry: Not on file  . Food insecurity - inability: Not on file  . Transportation needs - medical: Not on file  . Transportation needs - non-medical: Not on file  Occupational History  . Not on file  Tobacco Use  . Smoking status: Former Smoker    Last attempt to quit: 01/16/2004    Years since quitting: 13.0  . Smokeless tobacco: Never Used  Substance and Sexual Activity  . Alcohol use: Yes   Alcohol/week: 0.0 oz    Comment: rarely  . Drug use: No  . Sexual activity: Not on file  Other Topics Concern  . Not on file  Social History Narrative   Married.   No children.   Enjoys reading, traveling to the mountains.   Works in Therapist, art.    Past Surgical History:  Procedure Laterality Date  . BREAST BIOPSY Left 2014   neg  . CHOLECYSTECTOMY  2013    Family History  Problem Relation Age of Onset  . Cancer Father        skin  . Hyperlipidemia Father   . Hypertension Mother   . Cancer Maternal Grandfather        lung  . Breast cancer Neg Hx     Allergies  Allergen Reactions  . Sulfa Antibiotics Swelling    Tongue swelling  . Latex Rash    Current Outpatient Medications on File Prior to Visit  Medication Sig Dispense Refill  . Cholecalciferol (VITAMIN D) 2000 UNITS tablet Take 2,000 Units by mouth daily.    . Coenzyme Q10 (COQ10) 100 MG CAPS Take 1 capsule by mouth daily.    . Echinacea 400 MG CAPS Take 1 capsule by mouth daily.    Marland Kitchen EVENING PRIMROSE OIL PO Take 1,000 mg by mouth daily.    . sertraline (ZOLOFT) 25 MG tablet Take  1 tablet (25 mg total) by mouth daily. (Patient taking differently: Take 12.5 mg by mouth daily. ) 30 tablet 1  . simvastatin (ZOCOR) 20 MG tablet TAKE 1 TABLET (20 MG TOTAL) BY MOUTH DAILY. 90 tablet 1   No current facility-administered medications on file prior to visit.     BP 122/64 (BP Location: Left Arm, Patient Position: Sitting, Cuff Size: Normal)   Pulse 65   Temp 98.2 F (36.8 C) (Oral)   Wt 98 lb 8 oz (44.7 kg)   SpO2 100%   BMI 19.24 kg/m    Objective:   Physical Exam  Constitutional: She appears well-nourished.  Neck: Neck supple.  Cardiovascular: Normal rate and regular rhythm.  Pulmonary/Chest: Effort normal and breath sounds normal.  Skin: Skin is warm and dry.  Psychiatric: She has a normal mood and affect.  Mood improved          Assessment & Plan:

## 2017-02-15 ENCOUNTER — Other Ambulatory Visit: Payer: Self-pay | Admitting: Primary Care

## 2017-02-15 DIAGNOSIS — E785 Hyperlipidemia, unspecified: Secondary | ICD-10-CM

## 2017-05-11 ENCOUNTER — Other Ambulatory Visit: Payer: Self-pay | Admitting: Primary Care

## 2017-05-11 DIAGNOSIS — F329 Major depressive disorder, single episode, unspecified: Secondary | ICD-10-CM

## 2017-05-11 DIAGNOSIS — F419 Anxiety disorder, unspecified: Secondary | ICD-10-CM

## 2017-05-11 DIAGNOSIS — F32A Depression, unspecified: Secondary | ICD-10-CM

## 2017-05-13 NOTE — Telephone Encounter (Signed)
Ok to refill? Electronically refill request for sertraline (ZOLOFT) 25 MG tablet  Last seen and prescribed on 01/23/2017

## 2017-05-13 NOTE — Telephone Encounter (Signed)
Noted, refill sent to pharmacy. Patient taking 1/2 tablet daily.

## 2017-06-28 ENCOUNTER — Encounter: Payer: Self-pay | Admitting: Primary Care

## 2017-06-28 ENCOUNTER — Other Ambulatory Visit: Payer: Self-pay | Admitting: Primary Care

## 2017-06-28 DIAGNOSIS — F32A Depression, unspecified: Secondary | ICD-10-CM

## 2017-06-28 DIAGNOSIS — F329 Major depressive disorder, single episode, unspecified: Secondary | ICD-10-CM

## 2017-06-28 DIAGNOSIS — F419 Anxiety disorder, unspecified: Secondary | ICD-10-CM

## 2017-07-12 ENCOUNTER — Ambulatory Visit (INDEPENDENT_AMBULATORY_CARE_PROVIDER_SITE_OTHER): Payer: BLUE CROSS/BLUE SHIELD | Admitting: Psychology

## 2017-07-12 DIAGNOSIS — F411 Generalized anxiety disorder: Secondary | ICD-10-CM

## 2017-07-24 ENCOUNTER — Ambulatory Visit (INDEPENDENT_AMBULATORY_CARE_PROVIDER_SITE_OTHER): Payer: BLUE CROSS/BLUE SHIELD | Admitting: Psychology

## 2017-07-24 DIAGNOSIS — F411 Generalized anxiety disorder: Secondary | ICD-10-CM | POA: Diagnosis not present

## 2017-08-08 ENCOUNTER — Ambulatory Visit (INDEPENDENT_AMBULATORY_CARE_PROVIDER_SITE_OTHER): Payer: BLUE CROSS/BLUE SHIELD | Admitting: Psychology

## 2017-08-08 DIAGNOSIS — F411 Generalized anxiety disorder: Secondary | ICD-10-CM

## 2017-08-14 ENCOUNTER — Other Ambulatory Visit: Payer: Self-pay | Admitting: Primary Care

## 2017-08-14 DIAGNOSIS — E785 Hyperlipidemia, unspecified: Secondary | ICD-10-CM

## 2017-08-20 ENCOUNTER — Ambulatory Visit (INDEPENDENT_AMBULATORY_CARE_PROVIDER_SITE_OTHER): Payer: BLUE CROSS/BLUE SHIELD | Admitting: Psychology

## 2017-08-20 DIAGNOSIS — F411 Generalized anxiety disorder: Secondary | ICD-10-CM | POA: Diagnosis not present

## 2017-09-13 ENCOUNTER — Ambulatory Visit (INDEPENDENT_AMBULATORY_CARE_PROVIDER_SITE_OTHER): Payer: BLUE CROSS/BLUE SHIELD | Admitting: Psychology

## 2017-09-13 DIAGNOSIS — F411 Generalized anxiety disorder: Secondary | ICD-10-CM | POA: Diagnosis not present

## 2017-09-30 ENCOUNTER — Ambulatory Visit (INDEPENDENT_AMBULATORY_CARE_PROVIDER_SITE_OTHER): Payer: BLUE CROSS/BLUE SHIELD | Admitting: Psychology

## 2017-09-30 DIAGNOSIS — F411 Generalized anxiety disorder: Secondary | ICD-10-CM | POA: Diagnosis not present

## 2017-10-23 ENCOUNTER — Other Ambulatory Visit: Payer: Self-pay | Admitting: Primary Care

## 2017-10-23 DIAGNOSIS — Z1231 Encounter for screening mammogram for malignant neoplasm of breast: Secondary | ICD-10-CM

## 2017-10-30 ENCOUNTER — Ambulatory Visit (INDEPENDENT_AMBULATORY_CARE_PROVIDER_SITE_OTHER): Payer: BLUE CROSS/BLUE SHIELD | Admitting: Psychology

## 2017-10-30 DIAGNOSIS — F411 Generalized anxiety disorder: Secondary | ICD-10-CM | POA: Diagnosis not present

## 2017-11-09 ENCOUNTER — Other Ambulatory Visit: Payer: Self-pay | Admitting: Primary Care

## 2017-11-09 DIAGNOSIS — F329 Major depressive disorder, single episode, unspecified: Secondary | ICD-10-CM

## 2017-11-09 DIAGNOSIS — F419 Anxiety disorder, unspecified: Secondary | ICD-10-CM

## 2017-11-09 DIAGNOSIS — F32A Depression, unspecified: Secondary | ICD-10-CM

## 2017-11-13 ENCOUNTER — Ambulatory Visit (INDEPENDENT_AMBULATORY_CARE_PROVIDER_SITE_OTHER): Payer: BLUE CROSS/BLUE SHIELD | Admitting: Psychology

## 2017-11-13 DIAGNOSIS — F411 Generalized anxiety disorder: Secondary | ICD-10-CM

## 2017-11-20 ENCOUNTER — Ambulatory Visit
Admission: RE | Admit: 2017-11-20 | Discharge: 2017-11-20 | Disposition: A | Discharge status: Home / Self Care | Payer: BLUE CROSS/BLUE SHIELD | Source: Ambulatory Visit | Attending: Primary Care | Admitting: Primary Care

## 2017-11-20 DIAGNOSIS — Z1231 Encounter for screening mammogram for malignant neoplasm of breast: Secondary | ICD-10-CM | POA: Insufficient documentation

## 2017-11-26 ENCOUNTER — Ambulatory Visit (INDEPENDENT_AMBULATORY_CARE_PROVIDER_SITE_OTHER): Payer: BLUE CROSS/BLUE SHIELD | Admitting: Psychology

## 2017-11-26 DIAGNOSIS — F411 Generalized anxiety disorder: Secondary | ICD-10-CM

## 2017-12-10 ENCOUNTER — Ambulatory Visit (INDEPENDENT_AMBULATORY_CARE_PROVIDER_SITE_OTHER): Payer: BLUE CROSS/BLUE SHIELD | Admitting: Psychology

## 2017-12-10 DIAGNOSIS — F411 Generalized anxiety disorder: Secondary | ICD-10-CM | POA: Diagnosis not present

## 2017-12-17 ENCOUNTER — Other Ambulatory Visit: Payer: Self-pay | Admitting: Primary Care

## 2017-12-17 DIAGNOSIS — E7800 Pure hypercholesterolemia, unspecified: Secondary | ICD-10-CM

## 2017-12-23 ENCOUNTER — Other Ambulatory Visit (INDEPENDENT_AMBULATORY_CARE_PROVIDER_SITE_OTHER): Payer: BLUE CROSS/BLUE SHIELD

## 2017-12-23 DIAGNOSIS — E7800 Pure hypercholesterolemia, unspecified: Secondary | ICD-10-CM | POA: Diagnosis not present

## 2017-12-23 LAB — COMPREHENSIVE METABOLIC PANEL
ALT: 20 U/L (ref 0–35)
AST: 22 U/L (ref 0–37)
Albumin: 4.9 g/dL (ref 3.5–5.2)
Alkaline Phosphatase: 74 U/L (ref 39–117)
BUN: 22 mg/dL (ref 6–23)
CHLORIDE: 103 meq/L (ref 96–112)
CO2: 28 meq/L (ref 19–32)
CREATININE: 0.7 mg/dL (ref 0.40–1.20)
Calcium: 9.9 mg/dL (ref 8.4–10.5)
GFR: 89.37 mL/min (ref >60.00)
GLUCOSE: 101 mg/dL — AB (ref 70–99)
POTASSIUM: 4.4 meq/L (ref 3.5–5.1)
SODIUM: 139 meq/L (ref 135–145)
Total Bilirubin: 0.8 mg/dL (ref 0.2–1.2)
Total Protein: 7.7 g/dL (ref 6.0–8.3)

## 2017-12-23 LAB — LIPID PANEL
CHOL/HDL RATIO: 3
Cholesterol: 221 mg/dL — ABNORMAL HIGH (ref 0–200)
HDL: 78.5 mg/dL (ref >39.00)
LDL CALC: 123 mg/dL — AB (ref 0–99)
NONHDL: 142.38
Triglycerides: 97 mg/dL (ref 0.0–149.0)
VLDL: 19.4 mg/dL (ref 0.0–40.0)

## 2017-12-23 LAB — HEMOGLOBIN A1C: HEMOGLOBIN A1C: 5.6 % (ref 4.6–6.5)

## 2017-12-27 ENCOUNTER — Ambulatory Visit (INDEPENDENT_AMBULATORY_CARE_PROVIDER_SITE_OTHER): Payer: BLUE CROSS/BLUE SHIELD | Admitting: Primary Care

## 2017-12-27 VITALS — BP 116/72 | HR 72 | Temp 98.0°F | Ht 60.0 in | Wt 90.5 lb

## 2017-12-27 DIAGNOSIS — E7800 Pure hypercholesterolemia, unspecified: Secondary | ICD-10-CM | POA: Diagnosis not present

## 2017-12-27 DIAGNOSIS — M810 Age-related osteoporosis without current pathological fracture: Secondary | ICD-10-CM

## 2017-12-27 DIAGNOSIS — Z0000 Encounter for general adult medical examination without abnormal findings: Secondary | ICD-10-CM

## 2017-12-27 DIAGNOSIS — R920 Mammographic microcalcification found on diagnostic imaging of breast: Secondary | ICD-10-CM

## 2017-12-27 DIAGNOSIS — F329 Major depressive disorder, single episode, unspecified: Secondary | ICD-10-CM

## 2017-12-27 DIAGNOSIS — F419 Anxiety disorder, unspecified: Secondary | ICD-10-CM

## 2017-12-27 DIAGNOSIS — F32A Depression, unspecified: Secondary | ICD-10-CM

## 2017-12-27 MED ORDER — SIMVASTATIN 40 MG PO TABS: 40.0000 mg | ORAL_TABLET | Freq: Every day | ORAL | 3 refills | Status: DC

## 2017-12-27 NOTE — Assessment & Plan Note (Addendum)
Recent mammogram with BiRads 1. Continue annual screenings.

## 2017-12-27 NOTE — Patient Instructions (Addendum)
Start exercising. You should be getting 150 minutes of moderate intensity exercise weekly.  Ensure you are consuming 64 ounces of water daily.  Continue to work on a healthy diet.   We've increased your Simvastatin to 40 mg daily. You may take two of the 20 mg tablets to equal 40 mg.   Schedule your Pap smear anytime at your convenience. We also need to discuss Bone Density scan.  Schedule a lab only appointment for 2 months to repeat your cholesterol.   It was a pleasure to see you today!   Preventive Care 40-64 Years, Female Preventive care refers to lifestyle choices and visits with your health care provider that can promote health and wellness. What does preventive care include?  A yearly physical exam. This is also called an annual well check.  Dental exams once or twice a year.  Routine eye exams. Ask your health care provider how often you should have your eyes checked.  Personal lifestyle choices, including: ? Daily care of your teeth and gums. ? Regular physical activity. ? Eating a healthy diet. ? Avoiding tobacco and drug use. ? Limiting alcohol use. ? Practicing safe sex. ? Taking low-dose aspirin daily starting at age 60. ? Taking vitamin and mineral supplements as recommended by your health care provider. What happens during an annual well check? The services and screenings done by your health care provider during your annual well check will depend on your age, overall health, lifestyle risk factors, and family history of disease. Counseling Your health care provider may ask you questions about your:  Alcohol use.  Tobacco use.  Drug use.  Emotional well-being.  Home and relationship well-being.  Sexual activity.  Eating habits.  Work and work Statistician.  Method of birth control.  Menstrual cycle.  Pregnancy history.  Screening You may have the following tests or measurements:  Height, weight, and BMI.  Blood pressure.  Lipid and  cholesterol levels. These may be checked every 5 years, or more frequently if you are over 67 years old.  Skin check.  Lung cancer screening. You may have this screening every year starting at age 65 if you have a 30-pack-year history of smoking and currently smoke or have quit within the past 15 years.  Fecal occult blood test (FOBT) of the stool. You may have this test every year starting at age 53.  Flexible sigmoidoscopy or colonoscopy. You may have a sigmoidoscopy every 5 years or a colonoscopy every 10 years starting at age 72.  Hepatitis C blood test.  Hepatitis B blood test.  Sexually transmitted disease (STD) testing.  Diabetes screening. This is done by checking your blood sugar (glucose) after you have not eaten for a while (fasting). You may have this done every 1-3 years.  Mammogram. This may be done every 1-2 years. Talk to your health care provider about when you should start having regular mammograms. This may depend on whether you have a family history of breast cancer.  BRCA-related cancer screening. This may be done if you have a family history of breast, ovarian, tubal, or peritoneal cancers.  Pelvic exam and Pap test. This may be done every 3 years starting at age 74. Starting at age 37, this may be done every 5 years if you have a Pap test in combination with an HPV test.  Bone density scan. This is done to screen for osteoporosis. You may have this scan if you are at high risk for osteoporosis.  Discuss your test  results, treatment options, and if necessary, the need for more tests with your health care provider. Vaccines Your health care provider may recommend certain vaccines, such as:  Influenza vaccine. This is recommended every year.  Tetanus, diphtheria, and acellular pertussis (Tdap, Td) vaccine. You may need a Td booster every 10 years.  Varicella vaccine. You may need this if you have not been vaccinated.  Zoster vaccine. You may need this after age  54.  Measles, mumps, and rubella (MMR) vaccine. You may need at least one dose of MMR if you were born in 1957 or later. You may also need a second dose.  Pneumococcal 13-valent conjugate (PCV13) vaccine. You may need this if you have certain conditions and were not previously vaccinated.  Pneumococcal polysaccharide (PPSV23) vaccine. You may need one or two doses if you smoke cigarettes or if you have certain conditions.  Meningococcal vaccine. You may need this if you have certain conditions.  Hepatitis A vaccine. You may need this if you have certain conditions or if you travel or work in places where you may be exposed to hepatitis A.  Hepatitis B vaccine. You may need this if you have certain conditions or if you travel or work in places where you may be exposed to hepatitis B.  Haemophilus influenzae type b (Hib) vaccine. You may need this if you have certain conditions.  Talk to your health care provider about which screenings and vaccines you need and how often you need them. This information is not intended to replace advice given to you by your health care provider. Make sure you discuss any questions you have with your health care provider. Document Released: 01/28/2015 Document Revised: 09/21/2015 Document Reviewed: 11/02/2014 Elsevier Interactive Patient Education  [employer].

## 2017-12-27 NOTE — Assessment & Plan Note (Signed)
Non compliant to calcium and vitamin D due to renal stones.  Will have her start Vitamin D daily. Repeat bone density pended for 2020 due to insurance coverage.

## 2017-12-27 NOTE — Assessment & Plan Note (Addendum)
Compliant to Simvastatin 20 mg. LDL of 123 on recent labs. Will increase Simvastatin to 40 mg daily and repeat labs in 2 months.

## 2017-12-27 NOTE — Progress Notes (Signed)
Subjective:    Patient ID: Jeanella Craze, female    DOB: 22-Oct-1955, 62 y.o.   MRN: [id number]  HPI  Ms. Barcellos is a 62 year old female who presents today for complete physical.  Immunizations: -Tetanus: Completed in 2019 -Influenza: Completed this season  -Shingles: Completed in 2017  Diet: She endorses a fair diet Breakfast: Belvita Crackers Lunch: Wrap, veggies, yogurt, cookies Dinner: Vegetables, protein, starch Snacks: Fruit, crackers  Desserts: Daily. Cookies, candy, cake Beverages: Water, coffee, milk  Exercise: She is walking some, not regularly exercising  Eye exam: Completed in 2019 Dental exam: Completes semi-annually  Colonoscopy: never completed. Completed Cologuard in 2017. Dexa: Completed in 2017, osteopenia. Declines until April 2020. Pap Smear: Declines today, wants to reschedule.  Mammogram: Completed in November 2019  Wt Readings from Last 3 Encounters:  12/27/17 90 lb 8 oz (41.1 kg)  01/23/17 98 lb 8 oz (44.7 kg)  12/12/16 100 lb (45.4 kg)     Review of Systems  Constitutional: Negative for unexpected weight change.  HENT: Negative for rhinorrhea.   Respiratory: Negative for cough and shortness of breath.   Cardiovascular: Negative for chest pain.  Gastrointestinal: Negative for diarrhea.       Intermittent constipation   Genitourinary: Negative for difficulty urinating and menstrual problem.  Musculoskeletal: Negative for arthralgias.  Skin: Negative for rash.  Allergic/Immunologic: Negative for environmental allergies.  Neurological: Negative for dizziness, numbness and headaches.  Psychiatric/Behavioral:       Overall doing well on Zoloft.       Past Medical History:  Diagnosis Date  . History of UTI   . Hyperlipidemia   . Murmur      Social History   Socioeconomic History  . Marital status: Married    Spouse name: Not on file  . Number of children: Not on file  . Years of education: Not on file  . Highest education  level: Not on file  Occupational History  . Not on file  Social Needs  . Financial resource strain: Not on file  . Food insecurity:    Worry: Not on file    Inability: Not on file  . Transportation needs:    Medical: Not on file    Non-medical: Not on file  Tobacco Use  . Smoking status: Former Smoker    Last attempt to quit: 01/16/2004    Years since quitting: 13.9  . Smokeless tobacco: Never Used  Substance and Sexual Activity  . Alcohol use: Yes    Alcohol/week: 0.0 standard drinks    Comment: rarely  . Drug use: No  . Sexual activity: Not on file  Lifestyle  . Physical activity:    Days per week: Not on file    Minutes per session: Not on file  . Stress: Not on file  Relationships  . Social connections:    Talks on phone: Not on file    Gets together: Not on file    Attends religious service: Not on file    Active member of club or organization: Not on file    Attends meetings of clubs or organizations: Not on file    Relationship status: Not on file  . Intimate partner violence:    Fear of current or ex partner: Not on file    Emotionally abused: Not on file    Physically abused: Not on file    Forced sexual activity: Not on file  Other Topics Concern  . Not on file  Social  History Narrative   Married.   No children.   Enjoys reading, traveling to the mountains.   Works in Therapist, art.    Past Surgical History:  Procedure Laterality Date  . BREAST BIOPSY Left 2014   neg  . CHOLECYSTECTOMY  2013    Family History  Problem Relation Age of Onset  . Cancer Father        skin  . Hyperlipidemia Father   . Hypertension Mother   . Cancer Maternal Grandfather        lung  . Breast cancer Neg Hx     Allergies  Allergen Reactions  . Sulfa Antibiotics Swelling    Tongue swelling  . Latex Rash    Current Outpatient Medications on File Prior to Visit  Medication Sig Dispense Refill  . Coenzyme Q10 (COQ10) 100 MG CAPS Take 1 capsule by mouth daily.     . Echinacea 400 MG CAPS Take 1 capsule by mouth daily.    Marland Kitchen EVENING PRIMROSE OIL PO Take 1,000 mg by mouth daily.    Marland Kitchen MAGNESIUM PO Take by mouth.    . sertraline (ZOLOFT) 25 MG tablet TAKE 0.5 TABLETS (12.5 MG TOTAL) BY MOUTH DAILY. 45 tablet 0  . simvastatin (ZOCOR) 20 MG tablet TAKE 1 TABLET BY MOUTH EVERY DAY 90 tablet 1  . VITAMIN E PO      No current facility-administered medications on file prior to visit.     BP 116/72   Pulse 72   Temp 98 F (36.7 C) (Oral)   Ht 5' (1.524 m)   Wt 90 lb 8 oz (41.1 kg)   SpO2 98%   BMI 17.67 kg/m    Objective:   Physical Exam  Constitutional: She is oriented to person, place, and time. She appears well-nourished.  HENT:  Mouth/Throat: No oropharyngeal exudate.  Eyes: Pupils are equal, round, and reactive to light. EOM are normal.  Neck: Neck supple. No thyromegaly present.  Cardiovascular: Normal rate and regular rhythm.  Respiratory: Effort normal and breath sounds normal.  GI: Soft. Bowel sounds are normal. There is no abdominal tenderness.  Musculoskeletal: Normal range of motion.  Neurological: She is alert and oriented to person, place, and time.  Skin: Skin is warm and dry.  Psychiatric: She has a normal mood and affect.           Assessment & Plan:

## 2017-12-27 NOTE — Assessment & Plan Note (Signed)
Intermittent stress over the last one year, doing well on Zoloft 12.5 mg, does sometimes take the full 25 mg tablet.  Denies SI/HI. Continue same.

## 2017-12-27 NOTE — Assessment & Plan Note (Signed)
Immunizations UTD. Pap smear due, she would like to defer until 2020 due to insurance reasons.  Colon cancer screening UTD, due in 2020. Recommended weight bearing exercise, healthy diet. Exam stable. Labs reviewed. Follow up in 1 year for CPE.

## 2018-01-02 ENCOUNTER — Ambulatory Visit (INDEPENDENT_AMBULATORY_CARE_PROVIDER_SITE_OTHER): Payer: BLUE CROSS/BLUE SHIELD | Admitting: Psychology

## 2018-01-02 DIAGNOSIS — F411 Generalized anxiety disorder: Secondary | ICD-10-CM | POA: Diagnosis not present

## 2018-01-21 ENCOUNTER — Ambulatory Visit (INDEPENDENT_AMBULATORY_CARE_PROVIDER_SITE_OTHER): Payer: BLUE CROSS/BLUE SHIELD | Admitting: Psychology

## 2018-01-21 DIAGNOSIS — F411 Generalized anxiety disorder: Secondary | ICD-10-CM | POA: Diagnosis not present

## 2018-02-06 ENCOUNTER — Other Ambulatory Visit: Payer: Self-pay | Admitting: Primary Care

## 2018-02-06 DIAGNOSIS — F32A Depression, unspecified: Secondary | ICD-10-CM

## 2018-02-06 DIAGNOSIS — F419 Anxiety disorder, unspecified: Secondary | ICD-10-CM

## 2018-02-06 DIAGNOSIS — F329 Major depressive disorder, single episode, unspecified: Secondary | ICD-10-CM

## 2018-02-09 ENCOUNTER — Other Ambulatory Visit: Payer: Self-pay | Admitting: Primary Care

## 2018-02-09 DIAGNOSIS — E785 Hyperlipidemia, unspecified: Secondary | ICD-10-CM

## 2018-02-10 NOTE — Telephone Encounter (Signed)
Refill request came from pharmacy simvastatin 20 mg taking one daily. Per 12/27/17 office note simvastatin was increased to 40 mg daily and simvastatin 40 mg rx was sent electronically to [facility] on 12/27/17 . I spoke with Quillian Quince at [facility] and he found the simvastatin 40 mg rx and will get ready for pt. Quillian Quince will also d/c simvastatin 20 mg refills to avoid confusion.

## 2018-02-11 ENCOUNTER — Ambulatory Visit (INDEPENDENT_AMBULATORY_CARE_PROVIDER_SITE_OTHER): Payer: BLUE CROSS/BLUE SHIELD | Admitting: Psychology

## 2018-02-11 DIAGNOSIS — F411 Generalized anxiety disorder: Secondary | ICD-10-CM | POA: Diagnosis not present

## 2018-03-04 ENCOUNTER — Ambulatory Visit (INDEPENDENT_AMBULATORY_CARE_PROVIDER_SITE_OTHER): Payer: BLUE CROSS/BLUE SHIELD | Admitting: Psychology

## 2018-03-04 DIAGNOSIS — F411 Generalized anxiety disorder: Secondary | ICD-10-CM

## 2018-04-01 ENCOUNTER — Ambulatory Visit (INDEPENDENT_AMBULATORY_CARE_PROVIDER_SITE_OTHER): Payer: BLUE CROSS/BLUE SHIELD | Admitting: Psychology

## 2018-04-01 ENCOUNTER — Other Ambulatory Visit: Payer: Self-pay

## 2018-04-01 DIAGNOSIS — F410 Panic disorder [episodic paroxysmal anxiety] without agoraphobia: Secondary | ICD-10-CM | POA: Diagnosis not present

## 2018-04-30 ENCOUNTER — Other Ambulatory Visit: Payer: Self-pay

## 2018-05-06 ENCOUNTER — Ambulatory Visit (INDEPENDENT_AMBULATORY_CARE_PROVIDER_SITE_OTHER): Payer: Medicare Other | Admitting: Psychology

## 2018-05-06 DIAGNOSIS — F411 Generalized anxiety disorder: Secondary | ICD-10-CM

## 2018-05-07 ENCOUNTER — Ambulatory Visit: Payer: Self-pay | Admitting: Primary Care

## 2018-06-03 ENCOUNTER — Ambulatory Visit (INDEPENDENT_AMBULATORY_CARE_PROVIDER_SITE_OTHER): Payer: Self-pay | Admitting: Psychology

## 2018-06-03 DIAGNOSIS — F411 Generalized anxiety disorder: Secondary | ICD-10-CM

## 2018-06-06 DIAGNOSIS — Z1159 Encounter for screening for other viral diseases: Secondary | ICD-10-CM

## 2018-06-06 DIAGNOSIS — E7800 Pure hypercholesterolemia, unspecified: Secondary | ICD-10-CM

## 2018-07-04 ENCOUNTER — Other Ambulatory Visit (INDEPENDENT_AMBULATORY_CARE_PROVIDER_SITE_OTHER): Payer: Medicare Other

## 2018-07-04 ENCOUNTER — Other Ambulatory Visit: Payer: Self-pay

## 2018-07-04 DIAGNOSIS — Z1159 Encounter for screening for other viral diseases: Secondary | ICD-10-CM

## 2018-07-04 DIAGNOSIS — E7800 Pure hypercholesterolemia, unspecified: Secondary | ICD-10-CM

## 2018-07-04 LAB — LIPID PANEL
Cholesterol: 202 mg/dL — ABNORMAL HIGH (ref 0–200)
HDL: 75.9 mg/dL (ref >39.00)
LDL Cholesterol: 112 mg/dL — ABNORMAL HIGH (ref 0–99)
NonHDL: 126.41
Total CHOL/HDL Ratio: 3
Triglycerides: 73 mg/dL (ref 0.0–149.0)
VLDL: 14.6 mg/dL (ref 0.0–40.0)

## 2018-07-07 LAB — HEPATITIS C ANTIBODY
Hepatitis C Ab: NONREACTIVE
SIGNAL TO CUT-OFF: 0.02 (ref <1.00)

## 2018-07-11 ENCOUNTER — Encounter: Payer: Self-pay | Admitting: Primary Care

## 2018-07-11 ENCOUNTER — Other Ambulatory Visit: Payer: Self-pay

## 2018-07-11 ENCOUNTER — Other Ambulatory Visit (HOSPITAL_COMMUNITY)
Admission: RE | Admit: 2018-07-11 | Discharge: 2018-07-11 | Disposition: A | Discharge status: Home / Self Care | Payer: Medicare Other | Source: Ambulatory Visit | Attending: Primary Care | Admitting: Primary Care

## 2018-07-11 ENCOUNTER — Ambulatory Visit (INDEPENDENT_AMBULATORY_CARE_PROVIDER_SITE_OTHER): Payer: Medicare Other | Admitting: Primary Care

## 2018-07-11 VITALS — BP 116/72 | HR 60 | Temp 97.7°F | Ht 60.0 in | Wt 94.2 lb

## 2018-07-11 DIAGNOSIS — Z124 Encounter for screening for malignant neoplasm of cervix: Secondary | ICD-10-CM | POA: Insufficient documentation

## 2018-07-11 DIAGNOSIS — Z1151 Encounter for screening for human papillomavirus (HPV): Secondary | ICD-10-CM | POA: Diagnosis not present

## 2018-07-11 NOTE — Assessment & Plan Note (Signed)
Exam today unremarkable. Patient tolerated well. Given age and results, this should be her last pap smear.  Await results.

## 2018-07-11 NOTE — Patient Instructions (Signed)
We will be in touch with your pap smear results once received.  It was a pleasure to see you today!   Pap Test Why am I having this test? A Pap test, also called a Pap smear, is a screening test to check for signs of:  Cancer of the vagina, cervix, and uterus. The cervix is the lower part of the uterus that opens into the vagina.  Infection.  Changes that may be a sign that cancer is developing (precancerous changes). Women need this test on a regular basis. In general, you should have a Pap test every 3 years until you reach menopause or age 51. Women aged 30-60 may choose to have their Pap test done at the same time as an HPV (human papillomavirus) test every 5 years (instead of every 3 years). Your health care provider may recommend having Pap tests more or less often depending on your medical conditions and past Pap test results. What kind of sample is taken?  Your health care provider will collect a sample of cells from the surface of your cervix. This will be done using a small cotton swab, plastic spatula, or brush. This sample is often collected during a pelvic exam, when you are lying on your back on an exam table with feet in footrests (stirrups). In some cases, fluids (secretions) from the cervix or vagina may also be collected. How do I prepare for this test?  Be aware of where you are in your menstrual cycle. If you are menstruating on the day of the test, you may be asked to reschedule.  You may need to reschedule if you have a known vaginal infection on the day of the test.  Follow instructions from your health care provider about: ? Changing or stopping your regular medicines. Some medicines can cause abnormal test results, such as digitalis and tetracycline. ? Avoiding douching or taking a bath the day before or the day of the test. Tell a health care provider about:  Any allergies you have.  All medicines you are taking, including vitamins, herbs, eye drops, creams,  and over-the-counter medicines.  Any blood disorders you have.  Any surgeries you have had.  Any medical conditions you have.  Whether you are pregnant or may be pregnant. How are the results reported? Your test results will be reported as either abnormal or normal. A false-positive result can occur. A false positive is incorrect because it means that a condition is present when it is not. A false-negative result can occur. A false negative is incorrect because it means that a condition is not present when it is. What do the results mean? A normal test result means that you do not have signs of cancer of the vagina, cervix, or uterus. An abnormal result may mean that you have:  Cancer. A Pap test by itself is not enough to diagnose cancer. You will have more tests done in this case.  Precancerous changes in your vagina, cervix, or uterus.  Inflammation of the cervix.  An STD (sexually transmitted disease).  A fungal infection.  A parasite infection. Talk with your health care provider about what your results mean. Questions to ask your health care provider Ask your health care provider, or the department that is doing the test:  When will my results be ready?  How will I get my results?  What are my treatment options?  What other tests do I need?  What are my next steps? Summary  In general, women  should have a Pap test every 3 years until they reach menopause or age 38.  Your health care provider will collect a sample of cells from the surface of your cervix. This will be done using a small cotton swab, plastic spatula, or brush.  In some cases, fluids (secretions) from the cervix or vagina may also be collected. This information is not intended to replace advice given to you by your health care provider. Make sure you discuss any questions you have with your health care provider. Document Released: 03/24/2002 Document Revised: 09/10/2016 Document Reviewed:  09/10/2016 Elsevier Interactive Patient Education  2019 [employer].

## 2018-07-11 NOTE — Progress Notes (Signed)
Subjective:    Patient ID: Samantha Diaz, female    DOB: [birth year], 63 y.o.   MRN: [id number]  HPI  Samantha Diaz is a 63 year old female who presents today for pap smear only. She underwent CPE in December 2019 and wanted to defer her pap smear to later date.  She denies vaginal discharge, pelvic pain, dysuria, abdominal pain. She is not sexually active.   Review of Systems  Genitourinary: Negative for dysuria, flank pain, hematuria and vaginal discharge.       Past Medical History:  Diagnosis Date  . History of UTI   . Hyperlipidemia   . Murmur      Social History   Socioeconomic History  . Marital status: Married    Spouse name: Not on file  . Number of children: Not on file  . Years of education: Not on file  . Highest education level: Not on file  Occupational History  . Not on file  Social Needs  . Financial resource strain: Not on file  . Food insecurity    Worry: Not on file    Inability: Not on file  . Transportation needs    Medical: Not on file    Non-medical: Not on file  Tobacco Use  . Smoking status: Former Smoker    Quit date: 01/16/2004    Years since quitting: 14.4  . Smokeless tobacco: Never Used  Substance and Sexual Activity  . Alcohol use: Yes    Alcohol/week: 0.0 standard drinks    Comment: rarely  . Drug use: No  . Sexual activity: Not on file  Lifestyle  . Physical activity    Days per week: Not on file    Minutes per session: Not on file  . Stress: Not on file  Relationships  . Social Herbalist on phone: Not on file    Gets together: Not on file    Attends religious service: Not on file    Active member of club or organization: Not on file    Attends meetings of clubs or organizations: Not on file    Relationship status: Not on file  . Intimate partner violence    Fear of current or ex partner: Not on file    Emotionally abused: Not on file    Physically abused: Not on file    Forced sexual activity: Not on  file  Other Topics Concern  . Not on file  Social History Narrative   Married.   No children.   Enjoys reading, traveling to the mountains.   Works in Therapist, art.    Past Surgical History:  Procedure Laterality Date  . BREAST BIOPSY Left 2014   neg  . CHOLECYSTECTOMY  2013    Family History  Problem Relation Age of Onset  . Cancer Father        skin  . Hyperlipidemia Father   . Hypertension Mother   . Cancer Maternal Grandfather        lung  . Breast cancer Neg Hx     Allergies  Allergen Reactions  . Sulfa Antibiotics Swelling    Tongue swelling  . Latex Rash    Current Outpatient Medications on File Prior to Visit  Medication Sig Dispense Refill  . Coenzyme Q10 (COQ10) 100 MG CAPS Take 1 capsule by mouth daily.    . Echinacea 400 MG CAPS Take 1 capsule by mouth daily.    Marland Kitchen EVENING PRIMROSE OIL PO Take 1,000  mg by mouth daily.    Marland Kitchen MAGNESIUM PO Take by mouth.    . sertraline (ZOLOFT) 25 MG tablet TAKE 1/2 TABLET BY MOUTH EVERY DAY 45 tablet 1  . simvastatin (ZOCOR) 40 MG tablet Take 1 tablet (40 mg total) by mouth at bedtime. For cholesterol. 90 tablet 3  . VITAMIN E PO      No current facility-administered medications on file prior to visit.     BP 116/72   Pulse 60   Temp 97.7 F (36.5 C) (Tympanic)   Ht 5' (1.524 m)   Wt 94 lb 4 oz (42.8 kg)   SpO2 99%   BMI 18.41 kg/m    Objective:   Physical Exam  Constitutional: She appears well-nourished.  Neck: Neck supple.  Cardiovascular: Normal rate and regular rhythm.  Respiratory: Effort normal and breath sounds normal.  Genitourinary: There is no tenderness or lesion on the right labia. There is no tenderness or lesion on the left labia. Cervix exhibits no discharge. Right adnexum displays no tenderness. Left adnexum displays no tenderness.    No vaginal discharge or erythema.  No erythema in the vagina.  Skin: Skin is warm and dry.  Psychiatric: She has a normal mood and affect.            Assessment & Plan:

## 2018-07-15 ENCOUNTER — Ambulatory Visit (INDEPENDENT_AMBULATORY_CARE_PROVIDER_SITE_OTHER): Payer: Medicare Other | Admitting: Psychology

## 2018-07-15 DIAGNOSIS — F411 Generalized anxiety disorder: Secondary | ICD-10-CM

## 2018-07-15 LAB — CYTOLOGY - PAP
Diagnosis: NEGATIVE
HPV: NOT DETECTED

## 2018-08-12 ENCOUNTER — Ambulatory Visit (INDEPENDENT_AMBULATORY_CARE_PROVIDER_SITE_OTHER): Payer: Medicare Other | Admitting: Psychology

## 2018-08-12 DIAGNOSIS — F411 Generalized anxiety disorder: Secondary | ICD-10-CM

## 2018-08-18 DIAGNOSIS — H524 Presbyopia: Secondary | ICD-10-CM | POA: Diagnosis not present

## 2018-08-18 DIAGNOSIS — H2513 Age-related nuclear cataract, bilateral: Secondary | ICD-10-CM | POA: Diagnosis not present

## 2018-08-18 DIAGNOSIS — H5203 Hypermetropia, bilateral: Secondary | ICD-10-CM | POA: Diagnosis not present

## 2018-08-18 DIAGNOSIS — H52223 Regular astigmatism, bilateral: Secondary | ICD-10-CM | POA: Diagnosis not present

## 2018-09-09 ENCOUNTER — Ambulatory Visit (INDEPENDENT_AMBULATORY_CARE_PROVIDER_SITE_OTHER): Payer: Medicare Other | Admitting: Psychology

## 2018-09-09 DIAGNOSIS — F411 Generalized anxiety disorder: Secondary | ICD-10-CM

## 2018-10-07 ENCOUNTER — Ambulatory Visit (INDEPENDENT_AMBULATORY_CARE_PROVIDER_SITE_OTHER): Payer: Medicare Other | Admitting: Psychology

## 2018-10-07 DIAGNOSIS — F411 Generalized anxiety disorder: Secondary | ICD-10-CM

## 2018-10-17 ENCOUNTER — Other Ambulatory Visit: Payer: Self-pay | Admitting: Primary Care

## 2018-10-17 DIAGNOSIS — Z1231 Encounter for screening mammogram for malignant neoplasm of breast: Secondary | ICD-10-CM

## 2018-11-02 DIAGNOSIS — Z23 Encounter for immunization: Secondary | ICD-10-CM | POA: Diagnosis not present

## 2018-11-04 ENCOUNTER — Ambulatory Visit (INDEPENDENT_AMBULATORY_CARE_PROVIDER_SITE_OTHER): Payer: Medicare Other | Admitting: Psychology

## 2018-11-04 DIAGNOSIS — F411 Generalized anxiety disorder: Secondary | ICD-10-CM

## 2018-11-26 ENCOUNTER — Other Ambulatory Visit: Payer: Self-pay | Admitting: Primary Care

## 2018-11-26 DIAGNOSIS — F329 Major depressive disorder, single episode, unspecified: Secondary | ICD-10-CM

## 2018-11-26 DIAGNOSIS — F32A Depression, unspecified: Secondary | ICD-10-CM

## 2018-11-26 DIAGNOSIS — F419 Anxiety disorder, unspecified: Secondary | ICD-10-CM

## 2018-11-27 ENCOUNTER — Ambulatory Visit
Admission: RE | Admit: 2018-11-27 | Discharge: 2018-11-27 | Disposition: A | Discharge status: Home / Self Care | Payer: Medicare Other | Source: Ambulatory Visit | Attending: Primary Care | Admitting: Primary Care

## 2018-11-27 DIAGNOSIS — Z1231 Encounter for screening mammogram for malignant neoplasm of breast: Secondary | ICD-10-CM

## 2018-12-02 ENCOUNTER — Ambulatory Visit (INDEPENDENT_AMBULATORY_CARE_PROVIDER_SITE_OTHER): Payer: Medicare Other | Admitting: Psychology

## 2018-12-02 DIAGNOSIS — F411 Generalized anxiety disorder: Secondary | ICD-10-CM

## 2018-12-22 ENCOUNTER — Other Ambulatory Visit: Payer: Self-pay | Admitting: Primary Care

## 2018-12-22 DIAGNOSIS — E7800 Pure hypercholesterolemia, unspecified: Secondary | ICD-10-CM

## 2018-12-25 ENCOUNTER — Telehealth: Payer: Self-pay

## 2018-12-25 DIAGNOSIS — M9901 Segmental and somatic dysfunction of cervical region: Secondary | ICD-10-CM | POA: Diagnosis not present

## 2018-12-25 DIAGNOSIS — M5033 Other cervical disc degeneration, cervicothoracic region: Secondary | ICD-10-CM | POA: Diagnosis not present

## 2018-12-25 DIAGNOSIS — M9903 Segmental and somatic dysfunction of lumbar region: Secondary | ICD-10-CM | POA: Diagnosis not present

## 2018-12-25 DIAGNOSIS — M5412 Radiculopathy, cervical region: Secondary | ICD-10-CM | POA: Diagnosis not present

## 2018-12-25 NOTE — Telephone Encounter (Signed)
LVM w COVID screen, front door and back lab info 12.10.2020 TLJ

## 2018-12-26 DIAGNOSIS — M5033 Other cervical disc degeneration, cervicothoracic region: Secondary | ICD-10-CM | POA: Diagnosis not present

## 2018-12-26 DIAGNOSIS — M5412 Radiculopathy, cervical region: Secondary | ICD-10-CM | POA: Diagnosis not present

## 2018-12-26 DIAGNOSIS — M9903 Segmental and somatic dysfunction of lumbar region: Secondary | ICD-10-CM | POA: Diagnosis not present

## 2018-12-26 DIAGNOSIS — M9901 Segmental and somatic dysfunction of cervical region: Secondary | ICD-10-CM | POA: Diagnosis not present

## 2018-12-29 ENCOUNTER — Other Ambulatory Visit (INDEPENDENT_AMBULATORY_CARE_PROVIDER_SITE_OTHER): Payer: Medicare Other

## 2018-12-29 ENCOUNTER — Other Ambulatory Visit: Payer: Self-pay

## 2018-12-29 DIAGNOSIS — E7800 Pure hypercholesterolemia, unspecified: Secondary | ICD-10-CM | POA: Diagnosis not present

## 2018-12-29 LAB — LIPID PANEL
Cholesterol: 201 mg/dL — ABNORMAL HIGH (ref 0–200)
HDL: 67 mg/dL (ref >39.00)
LDL Cholesterol: 121 mg/dL — ABNORMAL HIGH (ref 0–99)
NonHDL: 134.18
Total CHOL/HDL Ratio: 3
Triglycerides: 65 mg/dL (ref 0.0–149.0)
VLDL: 13 mg/dL (ref 0.0–40.0)

## 2018-12-29 LAB — COMPREHENSIVE METABOLIC PANEL
ALT: 23 U/L (ref 0–35)
AST: 22 U/L (ref 0–37)
Albumin: 4.6 g/dL (ref 3.5–5.2)
Alkaline Phosphatase: 72 U/L (ref 39–117)
BUN: 16 mg/dL (ref 6–23)
CO2: 25 mEq/L (ref 19–32)
Calcium: 9.6 mg/dL (ref 8.4–10.5)
Chloride: 105 mEq/L (ref 96–112)
Creatinine, Ser: 0.74 mg/dL (ref 0.40–1.20)
GFR: 78.61 mL/min (ref >60.00)
Glucose, Bld: 101 mg/dL — ABNORMAL HIGH (ref 70–99)
Potassium: 4 mEq/L (ref 3.5–5.1)
Sodium: 138 mEq/L (ref 135–145)
Total Bilirubin: 0.6 mg/dL (ref 0.2–1.2)
Total Protein: 7.2 g/dL (ref 6.0–8.3)

## 2018-12-29 LAB — CBC
HCT: 40.9 % (ref 36.0–46.0)
Hemoglobin: 13.7 g/dL (ref 12.0–15.0)
MCHC: 33.4 g/dL (ref 30.0–36.0)
MCV: 93 fl (ref 78.0–100.0)
Platelets: 257 10*3/uL (ref 150.0–400.0)
RBC: 4.4 Mil/uL (ref 3.87–5.11)
RDW: 12.8 % (ref 11.5–15.5)
WBC: 6.3 10*3/uL (ref 4.0–10.5)

## 2018-12-31 ENCOUNTER — Ambulatory Visit (INDEPENDENT_AMBULATORY_CARE_PROVIDER_SITE_OTHER): Payer: Medicare Other | Admitting: Primary Care

## 2018-12-31 ENCOUNTER — Encounter: Payer: Self-pay | Admitting: Primary Care

## 2018-12-31 ENCOUNTER — Other Ambulatory Visit: Payer: Self-pay

## 2018-12-31 VITALS — BP 122/76 | HR 82 | Temp 97.2°F | Ht 60.0 in | Wt 94.5 lb

## 2018-12-31 DIAGNOSIS — Z0000 Encounter for general adult medical examination without abnormal findings: Secondary | ICD-10-CM | POA: Diagnosis not present

## 2018-12-31 DIAGNOSIS — Z23 Encounter for immunization: Secondary | ICD-10-CM | POA: Diagnosis not present

## 2018-12-31 DIAGNOSIS — E2839 Other primary ovarian failure: Secondary | ICD-10-CM | POA: Diagnosis not present

## 2018-12-31 DIAGNOSIS — F419 Anxiety disorder, unspecified: Secondary | ICD-10-CM

## 2018-12-31 DIAGNOSIS — E7800 Pure hypercholesterolemia, unspecified: Secondary | ICD-10-CM

## 2018-12-31 DIAGNOSIS — N952 Postmenopausal atrophic vaginitis: Secondary | ICD-10-CM

## 2018-12-31 DIAGNOSIS — Z1211 Encounter for screening for malignant neoplasm of colon: Secondary | ICD-10-CM

## 2018-12-31 DIAGNOSIS — M810 Age-related osteoporosis without current pathological fracture: Secondary | ICD-10-CM

## 2018-12-31 DIAGNOSIS — F32A Depression, unspecified: Secondary | ICD-10-CM

## 2018-12-31 DIAGNOSIS — F329 Major depressive disorder, single episode, unspecified: Secondary | ICD-10-CM

## 2018-12-31 NOTE — Assessment & Plan Note (Signed)
LDL the same as last year. ASCVD risk score of 4.7%, discussed this with patient. She will increase exercise, work on diet. Continue to monitor.

## 2018-12-31 NOTE — Progress Notes (Signed)
Patient ID: LEANDRIA MCCOSH, female   DOB: Nov 10, 1955, 63 y.o.   MRN: [id number]  HPI: Ms. Sura is a 63 year old female who presents today for Welcome to Medicare visit.  This visit occurred during the SARS-CoV-2 public health emergency.  Safety protocols were in place, including screening questions prior to the visit, additional usage of staff PPE, and extensive cleaning of exam room while observing appropriate contact time as indicated for disinfecting solutions.    Past Medical History:  Diagnosis Date  . History of UTI   . Hyperlipidemia   . Murmur     Current Outpatient Medications  Medication Sig Dispense Refill  . Coenzyme Q10 (COQ10) 100 MG CAPS Take 1 capsule by mouth daily.    . Echinacea 400 MG CAPS Take 1 capsule by mouth daily.    Marland Kitchen EVENING PRIMROSE OIL PO Take 1,000 mg by mouth daily.    Marland Kitchen MAGNESIUM PO Take by mouth.    . sertraline (ZOLOFT) 25 MG tablet TAKE 1/2 TABLET BY MOUTH EVERY DAY 45 tablet 1  . simvastatin (ZOCOR) 40 MG tablet Take 1 tablet (40 mg total) by mouth at bedtime. For cholesterol. 90 tablet 3  . VITAMIN E PO      No current facility-administered medications for this visit.    Allergies  Allergen Reactions  . Sulfa Antibiotics Swelling    Tongue swelling  . Latex Rash    Family History  Problem Relation Age of Onset  . Cancer Father        skin  . Hyperlipidemia Father   . Hypertension Mother   . Cancer Maternal Grandfather        lung  . Breast cancer Neg Hx     Social History   Socioeconomic History  . Marital status: Married    Spouse name: Not on file  . Number of children: Not on file  . Years of education: Not on file  . Highest education level: Not on file  Occupational History  . Not on file  Tobacco Use  . Smoking status: Former Smoker    Quit date: 01/16/2004    Years since quitting: 14.9  . Smokeless tobacco: Never Used  Substance and Sexual Activity  . Alcohol use: Yes    Alcohol/week: 0.0 standard drinks   Comment: rarely  . Drug use: No  . Sexual activity: Not on file  Other Topics Concern  . Not on file  Social History Narrative   Married.   No children.   Enjoys reading, traveling to the mountains.   Works in Therapist, art.   Social Determinants of Health   Financial Resource Strain:   . Difficulty of Paying Living Expenses: Not on file  Food Insecurity:   . Worried About Charity fundraiser in the Last Year: Not on file  . Ran Out of Food in the Last Year: Not on file  Transportation Needs:   . Lack of Transportation (Medical): Not on file  . Lack of Transportation (Non-Medical): Not on file  Physical Activity:   . Days of Exercise per Week: Not on file  . Minutes of Exercise per Session: Not on file  Stress:   . Feeling of Stress : Not on file  Social Connections:   . Frequency of Communication with Friends and Family: Not on file  . Frequency of Social Gatherings with Friends and Family: Not on file  . Attends Religious Services: Not on file  . Active Member of Clubs or  Organizations: Not on file  . Attends Archivist Meetings: Not on file  . Marital Status: Not on file  Intimate Partner Violence:   . Fear of Current or Ex-Partner: Not on file  . Emotionally Abused: Not on file  . Physically Abused: Not on file  . Sexually Abused: Not on file    Hospitiliaztions: None  Health Maintenance:    Flu: Completed this season   Tetanus: Completed in 2019  Pneumovax: Never completed  Prevnar: Due  Zostavax: Completed in 2017  Bone Density: Due, taking vitamin D.  Colonoscopy: Completed Cologuard in 2017, due  Pap: Completed in 2020, negative  Eye Doctor: Completed in 2020  Dental Exam: Completes annually   Mammogram: Completed in 2020  Hep C Screening: Negative in 2020  The 10-year ASCVD risk score Mikey Bussing DC Jr., et al., 2013) is: 4.7%   Values used to calculate the score:     Age: 63 years     Sex: Female     Is Non-Hispanic African American: No      Diabetic: No     Tobacco smoker: No     Systolic Blood Pressure: 123XX123 mmHg     Is BP treated: No     HDL Cholesterol: 67 mg/dL     Total Cholesterol: 201 mg/dL     Providers: Alma Friendly, PCP; Eye doctor; Dentist; therapy Marya Fossa), chiropractor    I have personally reviewed and have noted: 1. The patient's medical and social history 2. Their use of alcohol, tobacco or illicit drugs 3. Their current medications and supplements 4. The patient's functional ability including ADL's, fall risks, home safety risks and  hearing or visual impairment. 5. Diet and physical activities 6. Evidence for depression or mood disorder  Subjective:   Review of Systems:   Constitutional: Denies fever, malaise, fatigue, headache or abrupt weight changes.  HEENT: Denies eye pain, eye redness, ear pain, ringing in the ears, wax buildup, runny nose, nasal congestion, bloody nose, or sore throat. Respiratory: Denies difficulty breathing, shortness of breath, cough or sputum production.   Cardiovascular: Denies chest pain, chest tightness, palpitations or swelling in the hands or feet.  Gastrointestinal: Denies abdominal pain, bloating, constipation, diarrhea or blood in the stool.  GU: Denies urgency, frequency, pain with urination, burning sensation, blood in urine, odor or discharge. Musculoskeletal: Denies decrease in range of motion, difficulty with gait. Left lower extremity sciatica, carpal tunnel symptoms. Following with chiropractor.  Skin: Denies redness, rashes, lesions or ulcercations.  Neurological: Denies dizziness, difficulty with memory, difficulty with speech or problems with balance and coordination.  Psychiatric: Working with therapy for anxiety.   No other specific complaints in a complete review of systems (except as listed in HPI above).  Objective:  PE:   BP 122/76   Pulse 82   Temp (!) 97.2 F (36.2 C) (Temporal)   Ht 5' (1.524 m)   Wt 94 lb 8 oz (42.9 kg)   SpO2  98%   BMI 18.46 kg/m  Wt Readings from Last 3 Encounters:  12/31/18 94 lb 8 oz (42.9 kg)  07/11/18 94 lb 4 oz (42.8 kg)  12/27/17 90 lb 8 oz (41.1 kg)    General: Appears their stated age, well developed, well nourished in NAD. Skin: Warm, dry and intact. No rashes, lesions or ulcerations noted. HEENT: Head: normal shape and size; Eyes: sclera white, no icterus, conjunctiva pink, PERRLA and EOMs intact; Ears: Tm's gray and intact, normal light reflex; Nose: mucosa pink  and moist, septum midline; Throat/Mouth: Teeth present, mucosa pink and moist, no exudate, lesions or ulcerations noted.  Neck: Normal range of motion. Neck supple, trachea midline. No massses, lumps or thyromegaly present.  Cardiovascular: Normal rate and rhythm. S1,S2 noted.  No murmur, rubs or gallops noted. No JVD or BLE edema. No carotid bruits noted. Pulmonary/Chest: Normal effort and positive vesicular breath sounds. No respiratory distress. No wheezes, rales or ronchi noted.  Abdomen: Soft and nontender. Normal bowel sounds, no bruits noted. No distention or masses noted. Liver, spleen and kidneys non palpable. Musculoskeletal: Normal range of motion. No signs of joint swelling. No difficulty with gait.  Neurological: Alert and oriented. Cranial nerves II-XII intact. Coordination normal. +DTRs bilaterally. Psychiatric: Mood and affect normal. Behavior is normal. Judgment and thought content normal.   EKG: Due today. NSR with rate of 68. No PAC/PVS. No acute ST changes. T wave inversion in V2 which is unchanged from 2013. Overall ECG looks very much like 2013.  BMET    Component Value Date/Time   NA 138 12/29/2018 0837   K 4.0 12/29/2018 0837   CL 105 12/29/2018 0837   CO2 25 12/29/2018 0837   GLUCOSE 101 (H) 12/29/2018 0837   BUN 16 12/29/2018 0837   CREATININE 0.74 12/29/2018 0837   CALCIUM 9.6 12/29/2018 0837    Lipid Panel     Component Value Date/Time   CHOL 201 (H) 12/29/2018 0837   TRIG 65.0  12/29/2018 0837   HDL 67.00 12/29/2018 0837   CHOLHDL 3 12/29/2018 0837   VLDL 13.0 12/29/2018 0837   LDLCALC 121 (H) 12/29/2018 0837    CBC    Component Value Date/Time   WBC 6.3 12/29/2018 0837   RBC 4.40 12/29/2018 0837   HGB 13.7 12/29/2018 0837   HCT 40.9 12/29/2018 0837   PLT 257.0 12/29/2018 0837   MCV 93.0 12/29/2018 0837   MCHC 33.4 12/29/2018 0837   RDW 12.8 12/29/2018 0837    Hgb A1C Lab Results  Component Value Date   HGBA1C 5.6 12/23/2017      Assessment and Plan:   Medicare Annual Wellness Visit:  Diet: She endorses a poor diet. Physical activity: Sedentary Depression/mood screen: Negative Hearing: Intact to whispered voice Visual acuity: Grossly normal, performs annual eye exam  ADLs: Capable Fall risk: None Home safety: Good Cognitive evaluation: Intact to orientation, naming, recall and repetition EOL planning: Adv directives, full code/ I agree  Preventative Medicine:  Prevnar due today. Other immunizations UTD. Mammogram UTD. Bone density scan due, ordered. Colon cancer screening due, Cologuard ordered. Encouraged regular exercise, healthy diet. Exam today benign. Labs reviewed. All recommendations provided at end of visit.  Next appointment: One year

## 2018-12-31 NOTE — Patient Instructions (Signed)
Start exercising. You should be getting 150 minutes of moderate intensity exercise weekly.  It's important to improve your diet by reducing consumption of fast food, fried food, processed snack foods, sugary drinks. Increase consumption of fresh vegetables and fruits, whole grains, water.  Ensure you are drinking 64 ounces of water daily.  Call the breast center to schedule your bone density scan.  Complete the Cologuard Kit once received.  Continue to follow up with your therapist.   It was a pleasure to see you today!

## 2018-12-31 NOTE — Assessment & Plan Note (Signed)
Chronic with symptoms of vaginal dryness and burning. Discussed options, she will think about this and update.  Consider low dose estrace cream twice weekly.

## 2018-12-31 NOTE — Assessment & Plan Note (Signed)
Overall doing slightly better with Zoloft, working with therapy as well. Continue current regimen. Denies SI/HI.

## 2018-12-31 NOTE — Assessment & Plan Note (Signed)
Prevnar due today. Other immunizations UTD. Mammogram UTD. Bone density scan due, ordered. Colon cancer screening due, Cologuard ordered. Encouraged regular exercise, healthy diet. Exam today benign. Labs reviewed. All recommendations provided at end of visit.  I have personally reviewed and have noted: 1. The patient's medical and social history 2. Their use of alcohol, tobacco or illicit drugs 3. Their current medications and supplements 4. The patient's functional ability including ADL's, fall risks, home safety risks and  hearing or visual impairment. 5. Diet and physical activities 6. Evidence for depression or mood disorder

## 2018-12-31 NOTE — Assessment & Plan Note (Signed)
Compliant to vitamin D, not calcium. Discussed to start calcium, weight bearing exercise.  Repeat bone density scan due and ordered. Discussed treatment for osteoporosis including Fosamax or Prolia, she is open to treatment.  Await results.

## 2019-01-01 NOTE — Addendum Note (Signed)
Addended by: Jacqualin Combes on: 01/01/2019 09:59 AM   Modules accepted: Orders

## 2019-01-06 ENCOUNTER — Ambulatory Visit (INDEPENDENT_AMBULATORY_CARE_PROVIDER_SITE_OTHER): Payer: Medicare Other | Admitting: Psychology

## 2019-01-06 DIAGNOSIS — F411 Generalized anxiety disorder: Secondary | ICD-10-CM

## 2019-01-19 DIAGNOSIS — M9901 Segmental and somatic dysfunction of cervical region: Secondary | ICD-10-CM | POA: Diagnosis not present

## 2019-01-19 DIAGNOSIS — M9903 Segmental and somatic dysfunction of lumbar region: Secondary | ICD-10-CM | POA: Diagnosis not present

## 2019-01-19 DIAGNOSIS — M5033 Other cervical disc degeneration, cervicothoracic region: Secondary | ICD-10-CM | POA: Diagnosis not present

## 2019-01-19 DIAGNOSIS — M5412 Radiculopathy, cervical region: Secondary | ICD-10-CM | POA: Diagnosis not present

## 2019-01-22 DIAGNOSIS — M9903 Segmental and somatic dysfunction of lumbar region: Secondary | ICD-10-CM | POA: Diagnosis not present

## 2019-01-22 DIAGNOSIS — M9901 Segmental and somatic dysfunction of cervical region: Secondary | ICD-10-CM | POA: Diagnosis not present

## 2019-01-22 DIAGNOSIS — M5412 Radiculopathy, cervical region: Secondary | ICD-10-CM | POA: Diagnosis not present

## 2019-01-22 DIAGNOSIS — M5033 Other cervical disc degeneration, cervicothoracic region: Secondary | ICD-10-CM | POA: Diagnosis not present

## 2019-01-26 DIAGNOSIS — M9903 Segmental and somatic dysfunction of lumbar region: Secondary | ICD-10-CM | POA: Diagnosis not present

## 2019-01-26 DIAGNOSIS — M9901 Segmental and somatic dysfunction of cervical region: Secondary | ICD-10-CM | POA: Diagnosis not present

## 2019-01-26 DIAGNOSIS — M5033 Other cervical disc degeneration, cervicothoracic region: Secondary | ICD-10-CM | POA: Diagnosis not present

## 2019-01-26 DIAGNOSIS — M5412 Radiculopathy, cervical region: Secondary | ICD-10-CM | POA: Diagnosis not present

## 2019-01-29 DIAGNOSIS — M9901 Segmental and somatic dysfunction of cervical region: Secondary | ICD-10-CM | POA: Diagnosis not present

## 2019-01-29 DIAGNOSIS — M9903 Segmental and somatic dysfunction of lumbar region: Secondary | ICD-10-CM | POA: Diagnosis not present

## 2019-01-29 DIAGNOSIS — M5033 Other cervical disc degeneration, cervicothoracic region: Secondary | ICD-10-CM | POA: Diagnosis not present

## 2019-01-29 DIAGNOSIS — M5412 Radiculopathy, cervical region: Secondary | ICD-10-CM | POA: Diagnosis not present

## 2019-02-03 DIAGNOSIS — M9901 Segmental and somatic dysfunction of cervical region: Secondary | ICD-10-CM | POA: Diagnosis not present

## 2019-02-03 DIAGNOSIS — M5033 Other cervical disc degeneration, cervicothoracic region: Secondary | ICD-10-CM | POA: Diagnosis not present

## 2019-02-03 DIAGNOSIS — M9903 Segmental and somatic dysfunction of lumbar region: Secondary | ICD-10-CM | POA: Diagnosis not present

## 2019-02-03 DIAGNOSIS — M5412 Radiculopathy, cervical region: Secondary | ICD-10-CM | POA: Diagnosis not present

## 2019-02-05 ENCOUNTER — Ambulatory Visit (INDEPENDENT_AMBULATORY_CARE_PROVIDER_SITE_OTHER): Payer: Medicare Other | Admitting: Psychology

## 2019-02-05 DIAGNOSIS — F411 Generalized anxiety disorder: Secondary | ICD-10-CM

## 2019-02-06 DIAGNOSIS — M9901 Segmental and somatic dysfunction of cervical region: Secondary | ICD-10-CM | POA: Diagnosis not present

## 2019-02-06 DIAGNOSIS — M9903 Segmental and somatic dysfunction of lumbar region: Secondary | ICD-10-CM | POA: Diagnosis not present

## 2019-02-06 DIAGNOSIS — M5412 Radiculopathy, cervical region: Secondary | ICD-10-CM | POA: Diagnosis not present

## 2019-02-06 DIAGNOSIS — M5033 Other cervical disc degeneration, cervicothoracic region: Secondary | ICD-10-CM | POA: Diagnosis not present

## 2019-02-11 DIAGNOSIS — M5033 Other cervical disc degeneration, cervicothoracic region: Secondary | ICD-10-CM | POA: Diagnosis not present

## 2019-02-11 DIAGNOSIS — M9903 Segmental and somatic dysfunction of lumbar region: Secondary | ICD-10-CM | POA: Diagnosis not present

## 2019-02-11 DIAGNOSIS — M9901 Segmental and somatic dysfunction of cervical region: Secondary | ICD-10-CM | POA: Diagnosis not present

## 2019-02-11 DIAGNOSIS — M5412 Radiculopathy, cervical region: Secondary | ICD-10-CM | POA: Diagnosis not present

## 2019-02-18 DIAGNOSIS — M5033 Other cervical disc degeneration, cervicothoracic region: Secondary | ICD-10-CM | POA: Diagnosis not present

## 2019-02-18 DIAGNOSIS — M9903 Segmental and somatic dysfunction of lumbar region: Secondary | ICD-10-CM | POA: Diagnosis not present

## 2019-02-18 DIAGNOSIS — M9901 Segmental and somatic dysfunction of cervical region: Secondary | ICD-10-CM | POA: Diagnosis not present

## 2019-02-18 DIAGNOSIS — M5412 Radiculopathy, cervical region: Secondary | ICD-10-CM | POA: Diagnosis not present

## 2019-02-24 ENCOUNTER — Ambulatory Visit
Admission: RE | Admit: 2019-02-24 | Discharge: 2019-02-24 | Disposition: A | Discharge status: Home / Self Care | Payer: Medicare Other | Source: Ambulatory Visit | Attending: Primary Care | Admitting: Primary Care

## 2019-02-24 DIAGNOSIS — E2839 Other primary ovarian failure: Secondary | ICD-10-CM | POA: Insufficient documentation

## 2019-02-24 DIAGNOSIS — M810 Age-related osteoporosis without current pathological fracture: Secondary | ICD-10-CM | POA: Diagnosis not present

## 2019-02-24 DIAGNOSIS — Z780 Asymptomatic menopausal state: Secondary | ICD-10-CM | POA: Diagnosis not present

## 2019-03-03 ENCOUNTER — Other Ambulatory Visit: Payer: Self-pay | Admitting: Primary Care

## 2019-03-03 DIAGNOSIS — E7800 Pure hypercholesterolemia, unspecified: Secondary | ICD-10-CM

## 2019-03-04 DIAGNOSIS — M9903 Segmental and somatic dysfunction of lumbar region: Secondary | ICD-10-CM | POA: Diagnosis not present

## 2019-03-04 DIAGNOSIS — M9901 Segmental and somatic dysfunction of cervical region: Secondary | ICD-10-CM | POA: Diagnosis not present

## 2019-03-04 DIAGNOSIS — M5033 Other cervical disc degeneration, cervicothoracic region: Secondary | ICD-10-CM | POA: Diagnosis not present

## 2019-03-04 DIAGNOSIS — M5412 Radiculopathy, cervical region: Secondary | ICD-10-CM | POA: Diagnosis not present

## 2019-03-05 ENCOUNTER — Ambulatory Visit (INDEPENDENT_AMBULATORY_CARE_PROVIDER_SITE_OTHER): Payer: Medicare Other | Admitting: Psychology

## 2019-03-05 DIAGNOSIS — F411 Generalized anxiety disorder: Secondary | ICD-10-CM

## 2019-03-18 DIAGNOSIS — M9903 Segmental and somatic dysfunction of lumbar region: Secondary | ICD-10-CM | POA: Diagnosis not present

## 2019-03-18 DIAGNOSIS — M5412 Radiculopathy, cervical region: Secondary | ICD-10-CM | POA: Diagnosis not present

## 2019-03-18 DIAGNOSIS — M9901 Segmental and somatic dysfunction of cervical region: Secondary | ICD-10-CM | POA: Diagnosis not present

## 2019-03-18 DIAGNOSIS — M5033 Other cervical disc degeneration, cervicothoracic region: Secondary | ICD-10-CM | POA: Diagnosis not present

## 2019-04-01 DIAGNOSIS — M5412 Radiculopathy, cervical region: Secondary | ICD-10-CM | POA: Diagnosis not present

## 2019-04-01 DIAGNOSIS — M9903 Segmental and somatic dysfunction of lumbar region: Secondary | ICD-10-CM | POA: Diagnosis not present

## 2019-04-01 DIAGNOSIS — M9901 Segmental and somatic dysfunction of cervical region: Secondary | ICD-10-CM | POA: Diagnosis not present

## 2019-04-01 DIAGNOSIS — M5033 Other cervical disc degeneration, cervicothoracic region: Secondary | ICD-10-CM | POA: Diagnosis not present

## 2019-04-09 ENCOUNTER — Ambulatory Visit (INDEPENDENT_AMBULATORY_CARE_PROVIDER_SITE_OTHER): Payer: Medicare Other | Admitting: Psychology

## 2019-04-09 DIAGNOSIS — F411 Generalized anxiety disorder: Secondary | ICD-10-CM

## 2019-04-15 DIAGNOSIS — M9901 Segmental and somatic dysfunction of cervical region: Secondary | ICD-10-CM | POA: Diagnosis not present

## 2019-04-15 DIAGNOSIS — M5033 Other cervical disc degeneration, cervicothoracic region: Secondary | ICD-10-CM | POA: Diagnosis not present

## 2019-04-15 DIAGNOSIS — M5412 Radiculopathy, cervical region: Secondary | ICD-10-CM | POA: Diagnosis not present

## 2019-04-15 DIAGNOSIS — M9903 Segmental and somatic dysfunction of lumbar region: Secondary | ICD-10-CM | POA: Diagnosis not present

## 2019-05-04 DIAGNOSIS — Z1211 Encounter for screening for malignant neoplasm of colon: Secondary | ICD-10-CM | POA: Diagnosis not present

## 2019-05-04 DIAGNOSIS — Z1212 Encounter for screening for malignant neoplasm of rectum: Secondary | ICD-10-CM | POA: Diagnosis not present

## 2019-05-06 DIAGNOSIS — M5412 Radiculopathy, cervical region: Secondary | ICD-10-CM | POA: Diagnosis not present

## 2019-05-06 DIAGNOSIS — M5033 Other cervical disc degeneration, cervicothoracic region: Secondary | ICD-10-CM | POA: Diagnosis not present

## 2019-05-06 DIAGNOSIS — M9901 Segmental and somatic dysfunction of cervical region: Secondary | ICD-10-CM | POA: Diagnosis not present

## 2019-05-06 DIAGNOSIS — M9903 Segmental and somatic dysfunction of lumbar region: Secondary | ICD-10-CM | POA: Diagnosis not present

## 2019-05-08 LAB — COLOGUARD: COLOGUARD: NEGATIVE

## 2019-05-15 ENCOUNTER — Ambulatory Visit (INDEPENDENT_AMBULATORY_CARE_PROVIDER_SITE_OTHER): Payer: Medicare Other | Admitting: Psychology

## 2019-05-15 DIAGNOSIS — F411 Generalized anxiety disorder: Secondary | ICD-10-CM

## 2019-05-23 ENCOUNTER — Other Ambulatory Visit: Payer: Self-pay | Admitting: Primary Care

## 2019-05-23 DIAGNOSIS — F32A Depression, unspecified: Secondary | ICD-10-CM

## 2019-05-23 DIAGNOSIS — F329 Major depressive disorder, single episode, unspecified: Secondary | ICD-10-CM

## 2019-05-23 DIAGNOSIS — F419 Anxiety disorder, unspecified: Secondary | ICD-10-CM

## 2019-05-27 DIAGNOSIS — M5033 Other cervical disc degeneration, cervicothoracic region: Secondary | ICD-10-CM | POA: Diagnosis not present

## 2019-05-27 DIAGNOSIS — M9903 Segmental and somatic dysfunction of lumbar region: Secondary | ICD-10-CM | POA: Diagnosis not present

## 2019-05-27 DIAGNOSIS — M9901 Segmental and somatic dysfunction of cervical region: Secondary | ICD-10-CM | POA: Diagnosis not present

## 2019-05-27 DIAGNOSIS — M5412 Radiculopathy, cervical region: Secondary | ICD-10-CM | POA: Diagnosis not present

## 2019-06-17 DIAGNOSIS — M9903 Segmental and somatic dysfunction of lumbar region: Secondary | ICD-10-CM | POA: Diagnosis not present

## 2019-06-17 DIAGNOSIS — M9901 Segmental and somatic dysfunction of cervical region: Secondary | ICD-10-CM | POA: Diagnosis not present

## 2019-06-17 DIAGNOSIS — M5033 Other cervical disc degeneration, cervicothoracic region: Secondary | ICD-10-CM | POA: Diagnosis not present

## 2019-06-17 DIAGNOSIS — M5412 Radiculopathy, cervical region: Secondary | ICD-10-CM | POA: Diagnosis not present

## 2019-06-18 ENCOUNTER — Ambulatory Visit (INDEPENDENT_AMBULATORY_CARE_PROVIDER_SITE_OTHER): Payer: Medicare Other | Admitting: Psychology

## 2019-06-18 DIAGNOSIS — F411 Generalized anxiety disorder: Secondary | ICD-10-CM

## 2019-06-25 ENCOUNTER — Ambulatory Visit: Payer: 59 | Admitting: Psychology

## 2019-07-24 ENCOUNTER — Ambulatory Visit (INDEPENDENT_AMBULATORY_CARE_PROVIDER_SITE_OTHER): Payer: Medicare Other | Admitting: Psychology

## 2019-07-24 DIAGNOSIS — F411 Generalized anxiety disorder: Secondary | ICD-10-CM

## 2019-08-21 ENCOUNTER — Other Ambulatory Visit: Payer: Self-pay | Admitting: Primary Care

## 2019-08-21 DIAGNOSIS — E7800 Pure hypercholesterolemia, unspecified: Secondary | ICD-10-CM

## 2019-09-18 ENCOUNTER — Ambulatory Visit (INDEPENDENT_AMBULATORY_CARE_PROVIDER_SITE_OTHER): Payer: Medicare Other | Admitting: Psychology

## 2019-09-18 DIAGNOSIS — F411 Generalized anxiety disorder: Secondary | ICD-10-CM

## 2019-10-23 ENCOUNTER — Other Ambulatory Visit: Payer: Self-pay | Admitting: Primary Care

## 2019-10-23 DIAGNOSIS — Z1231 Encounter for screening mammogram for malignant neoplasm of breast: Secondary | ICD-10-CM

## 2019-11-19 ENCOUNTER — Other Ambulatory Visit: Payer: Self-pay | Admitting: Primary Care

## 2019-11-19 DIAGNOSIS — F419 Anxiety disorder, unspecified: Secondary | ICD-10-CM

## 2019-11-19 DIAGNOSIS — F32A Depression, unspecified: Secondary | ICD-10-CM

## 2019-11-20 ENCOUNTER — Ambulatory Visit (INDEPENDENT_AMBULATORY_CARE_PROVIDER_SITE_OTHER): Payer: Medicare Other | Admitting: Psychology

## 2019-11-20 DIAGNOSIS — F411 Generalized anxiety disorder: Secondary | ICD-10-CM

## 2019-12-04 ENCOUNTER — Other Ambulatory Visit: Payer: Self-pay

## 2019-12-04 ENCOUNTER — Ambulatory Visit
Admission: RE | Admit: 2019-12-04 | Discharge: 2019-12-04 | Disposition: A | Discharge status: Home / Self Care | Payer: Medicare Other | Source: Ambulatory Visit | Attending: Primary Care | Admitting: Primary Care

## 2019-12-04 DIAGNOSIS — Z1231 Encounter for screening mammogram for malignant neoplasm of breast: Secondary | ICD-10-CM | POA: Insufficient documentation

## 2019-12-05 ENCOUNTER — Ambulatory Visit: Payer: Medicare Other | Attending: Internal Medicine

## 2019-12-05 DIAGNOSIS — Z23 Encounter for immunization: Secondary | ICD-10-CM

## 2019-12-05 NOTE — Progress Notes (Signed)
   Covid-19 Vaccination Clinic  Name:  Samantha Diaz    MRN: [id number] DOB: July 10, 1955  12/05/2019  Samantha Diaz was observed post Covid-19 immunization for 15 minutes without incident. She was provided with Vaccine Information Sheet and instruction to access the V-Safe system.   Samantha Diaz was instructed to call 911 with any severe reactions post vaccine: Marland Kitchen Difficulty breathing  . Swelling of face and throat  . A fast heartbeat  . A bad rash all over body  . Dizziness and weakness   Immunizations Administered    Name Date Dose VIS Date Route   JANSSEN COVID-19 VACCINE 12/05/2019 12:27 PM 0.5 mL 11/04/2019 Intramuscular   Manufacturer: Alphonsa [employer]   Lot: [id number]   [place]: [id number]

## 2019-12-18 ENCOUNTER — Other Ambulatory Visit: Payer: Self-pay | Admitting: Primary Care

## 2019-12-18 DIAGNOSIS — H524 Presbyopia: Secondary | ICD-10-CM | POA: Diagnosis not present

## 2019-12-18 DIAGNOSIS — H2513 Age-related nuclear cataract, bilateral: Secondary | ICD-10-CM | POA: Diagnosis not present

## 2019-12-18 DIAGNOSIS — E7800 Pure hypercholesterolemia, unspecified: Secondary | ICD-10-CM

## 2019-12-18 DIAGNOSIS — H52223 Regular astigmatism, bilateral: Secondary | ICD-10-CM | POA: Diagnosis not present

## 2019-12-18 DIAGNOSIS — H5203 Hypermetropia, bilateral: Secondary | ICD-10-CM | POA: Diagnosis not present

## 2019-12-18 DIAGNOSIS — Z135 Encounter for screening for eye and ear disorders: Secondary | ICD-10-CM | POA: Diagnosis not present

## 2019-12-25 ENCOUNTER — Other Ambulatory Visit (INDEPENDENT_AMBULATORY_CARE_PROVIDER_SITE_OTHER): Payer: Medicare Other

## 2019-12-25 ENCOUNTER — Other Ambulatory Visit: Payer: Self-pay

## 2019-12-25 DIAGNOSIS — E7800 Pure hypercholesterolemia, unspecified: Secondary | ICD-10-CM

## 2019-12-25 LAB — LIPID PANEL
Cholesterol: 205 mg/dL — ABNORMAL HIGH (ref 0–200)
HDL: 69.3 mg/dL (ref >39.00)
LDL Cholesterol: 110 mg/dL — ABNORMAL HIGH (ref 0–99)
NonHDL: 135.68
Total CHOL/HDL Ratio: 3
Triglycerides: 126 mg/dL (ref 0.0–149.0)
VLDL: 25.2 mg/dL (ref 0.0–40.0)

## 2019-12-25 LAB — COMPREHENSIVE METABOLIC PANEL
ALT: 18 U/L (ref 0–35)
AST: 19 U/L (ref 0–37)
Albumin: 4.5 g/dL (ref 3.5–5.2)
Alkaline Phosphatase: 59 U/L (ref 39–117)
BUN: 15 mg/dL (ref 6–23)
CO2: 26 mEq/L (ref 19–32)
Calcium: 9.5 mg/dL (ref 8.4–10.5)
Chloride: 105 mEq/L (ref 96–112)
Creatinine, Ser: 0.72 mg/dL (ref 0.40–1.20)
GFR: 87.01 mL/min (ref >60.00)
Glucose, Bld: 92 mg/dL (ref 70–99)
Potassium: 4.1 mEq/L (ref 3.5–5.1)
Sodium: 139 mEq/L (ref 135–145)
Total Bilirubin: 0.7 mg/dL (ref 0.2–1.2)
Total Protein: 7.3 g/dL (ref 6.0–8.3)

## 2019-12-25 LAB — HEMOGLOBIN A1C: Hgb A1c MFr Bld: 5.6 % (ref 4.6–6.5)

## 2020-01-01 ENCOUNTER — Encounter: Payer: Self-pay | Admitting: Primary Care

## 2020-01-01 ENCOUNTER — Ambulatory Visit (INDEPENDENT_AMBULATORY_CARE_PROVIDER_SITE_OTHER): Payer: Medicare Other | Admitting: Primary Care

## 2020-01-01 ENCOUNTER — Other Ambulatory Visit: Payer: Self-pay

## 2020-01-01 VITALS — BP 115/64 | HR 84 | Temp 98.0°F | Ht 60.0 in | Wt 103.8 lb

## 2020-01-01 DIAGNOSIS — R011 Cardiac murmur, unspecified: Secondary | ICD-10-CM

## 2020-01-01 DIAGNOSIS — R920 Mammographic microcalcification found on diagnostic imaging of breast: Secondary | ICD-10-CM | POA: Diagnosis not present

## 2020-01-01 DIAGNOSIS — F419 Anxiety disorder, unspecified: Secondary | ICD-10-CM | POA: Diagnosis not present

## 2020-01-01 DIAGNOSIS — Z23 Encounter for immunization: Secondary | ICD-10-CM

## 2020-01-01 DIAGNOSIS — E7800 Pure hypercholesterolemia, unspecified: Secondary | ICD-10-CM | POA: Diagnosis not present

## 2020-01-01 DIAGNOSIS — F32A Depression, unspecified: Secondary | ICD-10-CM

## 2020-01-01 DIAGNOSIS — M810 Age-related osteoporosis without current pathological fracture: Secondary | ICD-10-CM

## 2020-01-01 NOTE — Patient Instructions (Signed)
Start exercising. You should be getting 150 minutes of moderate intensity exercise weekly.  It's important to improve your diet by reducing consumption of fast food, fried food, processed snack foods, sugary drinks. Increase consumption of fresh vegetables and fruits, whole grains, water.  Ensure you are drinking 64 ounces of water daily.  Please schedule a phone visit with our Medicare Nurse Calandra.  It was a pleasure to see you today!

## 2020-01-01 NOTE — Assessment & Plan Note (Signed)
Noted on exam, asymptomatic. Continue to monitor.

## 2020-01-01 NOTE — Assessment & Plan Note (Signed)
Denies concerns today despite PHQ 9 score. Continue therapy.   She will update if anything changes.

## 2020-01-01 NOTE — Assessment & Plan Note (Signed)
Mammogram UTD, continue annual screenings.  

## 2020-01-01 NOTE — Assessment & Plan Note (Deleted)
Pneumovax and influenza vaccines due, provided today. Mammogram and bone density UTD. Colon cancer screening UTD, due in 2024.  Discussed the importance of a healthy diet and regular exercise in order for weight loss, and to reduce the risk of any potential medical problems.  Exam today unremarkable. Labs reviewed.

## 2020-01-01 NOTE — Progress Notes (Signed)
Subjective:    Patient ID: Samantha Diaz, female    DOB: 1955-05-04, 64 y.o.   MRN: [id number]  HPI  This visit occurred during the SARS-CoV-2 public health emergency.  Safety protocols were in place, including screening questions prior to the visit, additional usage of staff PPE, and extensive cleaning of exam room while observing appropriate contact time as indicated for disinfecting solutions.   Samantha Diaz is a 64 year old female who presents today for general follow up.  History of osteoporosis, bone density scan completed in April 2021, she is compliant to calcium and vitamin D daily, is not exercising or walking much.   She is compliant to her simvastatin daily, but she admits to being more sedentary and is eating an unhealthy diet.   Immunizations: -Tetanus: Completed in 2019 -Influenza: Due today -Shingles: Completed Zostavax in 2017 -Pneumonia: Completed Prevnar in 2020 -Covid-19: Completed series  Mammogram: Completed in 2021 Dexa: Completed in 2021 Colonoscopy: Completed Cologuard in 2021, due in 2024 Hep C Screen: Negative  BP Readings from Last 3 Encounters:  01/01/20 115/64  12/31/18 122/76  07/11/18 116/72       Review of Systems  Respiratory: Negative for shortness of breath.   Cardiovascular: Negative for chest pain.  Gastrointestinal: Negative for constipation and diarrhea.  Neurological: Negative for dizziness and headaches.  Psychiatric/Behavioral: The patient is not nervous/anxious.        Overall doing well off Zoloft, feels well managed.        Past Medical History:  Diagnosis Date  . History of UTI   . Hyperlipidemia   . Murmur      Social History   Socioeconomic History  . Marital status: Married    Spouse name: Not on file  . Number of children: Not on file  . Years of education: Not on file  . Highest education level: Not on file  Occupational History  . Not on file  Tobacco Use  . Smoking status: Former Smoker    Quit  date: 01/16/2004    Years since quitting: 15.9  . Smokeless tobacco: Never Used  Substance and Sexual Activity  . Alcohol use: Yes    Alcohol/week: 0.0 standard drinks    Comment: rarely  . Drug use: No  . Sexual activity: Not on file  Other Topics Concern  . Not on file  Social History Narrative   Married.   No children.   Enjoys reading, traveling to the mountains.   Works in Therapist, art.   Social Determinants of Health   Financial Resource Strain: Not on file  Food Insecurity: Not on file  Transportation Needs: Not on file  Physical Activity: Not on file  Stress: Not on file  Social Connections: Not on file  Intimate Partner Violence: Not on file    Past Surgical History:  Procedure Laterality Date  . BREAST BIOPSY Left 2014   neg-Fibrocystic changes  . CHOLECYSTECTOMY  2013    Family History  Problem Relation Age of Onset  . Cancer Father        skin  . Hyperlipidemia Father   . Hypertension Mother   . Cancer Maternal Grandfather        lung  . Breast cancer Neg Hx     Allergies  Allergen Reactions  . Sulfa Antibiotics Swelling    Tongue swelling  . Latex Rash    Current Outpatient Medications on File Prior to Visit  Medication Sig Dispense Refill  . Coenzyme Q10 (  COQ10) 100 MG CAPS Take 1 capsule by mouth daily.    . Echinacea 400 MG CAPS Take 1 capsule by mouth daily.    . simvastatin (ZOCOR) 40 MG tablet TAKE 1 TABLET BY MOUTH EVERY NIGHT AT BEDTIME FOR CHOLESTEROL 90 tablet 1  . VITAMIN E PO      No current facility-administered medications on file prior to visit.    BP 115/64   Pulse 84   Temp 98 F (36.7 C) (Temporal)   Ht 5' (1.524 m)   Wt 103 lb 12.8 oz (47.1 kg)   SpO2 99%   BMI 20.27 kg/m    Objective:   Physical Exam Constitutional:      Appearance: She is well-nourished.  Cardiovascular:     Rate and Rhythm: Normal rate and regular rhythm.  Pulmonary:     Effort: Pulmonary effort is normal.     Breath sounds: Normal  breath sounds.  Musculoskeletal:     Cervical back: Neck supple.  Skin:    General: Skin is warm and dry.  Psychiatric:        Mood and Affect: Mood and affect and mood normal.            Assessment & Plan:

## 2020-01-01 NOTE — Assessment & Plan Note (Signed)
Compliant to calcium and vitamin D, is not exercising.  Never returned my message regarding Rx treatment for osteoporosis.   Today she declines Rx treatment. Encouraged continued use of calcium and vitamin D, discussed to start weight bearing exercise.  Repeat bone density scan in 2 years.

## 2020-01-01 NOTE — Assessment & Plan Note (Signed)
Improved from last year, compliant to simvastatin. Encouraged to improve diet and start exercising.

## 2020-01-22 ENCOUNTER — Ambulatory Visit: Payer: 59

## 2020-01-22 ENCOUNTER — Ambulatory Visit (INDEPENDENT_AMBULATORY_CARE_PROVIDER_SITE_OTHER): Payer: Medicare Other | Admitting: Psychology

## 2020-01-22 DIAGNOSIS — F411 Generalized anxiety disorder: Secondary | ICD-10-CM

## 2020-02-17 ENCOUNTER — Other Ambulatory Visit: Payer: Self-pay | Admitting: Primary Care

## 2020-02-17 DIAGNOSIS — E7800 Pure hypercholesterolemia, unspecified: Secondary | ICD-10-CM

## 2020-03-16 DIAGNOSIS — M5033 Other cervical disc degeneration, cervicothoracic region: Secondary | ICD-10-CM | POA: Diagnosis not present

## 2020-03-16 DIAGNOSIS — M62830 Muscle spasm of back: Secondary | ICD-10-CM | POA: Diagnosis not present

## 2020-03-16 DIAGNOSIS — M9901 Segmental and somatic dysfunction of cervical region: Secondary | ICD-10-CM | POA: Diagnosis not present

## 2020-03-16 DIAGNOSIS — M9903 Segmental and somatic dysfunction of lumbar region: Secondary | ICD-10-CM | POA: Diagnosis not present

## 2020-03-18 ENCOUNTER — Ambulatory Visit (INDEPENDENT_AMBULATORY_CARE_PROVIDER_SITE_OTHER): Payer: Medicare Other | Admitting: Psychology

## 2020-03-18 DIAGNOSIS — F411 Generalized anxiety disorder: Secondary | ICD-10-CM

## 2020-03-21 DIAGNOSIS — M62830 Muscle spasm of back: Secondary | ICD-10-CM | POA: Diagnosis not present

## 2020-03-21 DIAGNOSIS — M9903 Segmental and somatic dysfunction of lumbar region: Secondary | ICD-10-CM | POA: Diagnosis not present

## 2020-03-21 DIAGNOSIS — M9901 Segmental and somatic dysfunction of cervical region: Secondary | ICD-10-CM | POA: Diagnosis not present

## 2020-03-21 DIAGNOSIS — M5033 Other cervical disc degeneration, cervicothoracic region: Secondary | ICD-10-CM | POA: Diagnosis not present

## 2020-03-23 DIAGNOSIS — M62830 Muscle spasm of back: Secondary | ICD-10-CM | POA: Diagnosis not present

## 2020-03-23 DIAGNOSIS — M5033 Other cervical disc degeneration, cervicothoracic region: Secondary | ICD-10-CM | POA: Diagnosis not present

## 2020-03-23 DIAGNOSIS — M9903 Segmental and somatic dysfunction of lumbar region: Secondary | ICD-10-CM | POA: Diagnosis not present

## 2020-03-23 DIAGNOSIS — M9901 Segmental and somatic dysfunction of cervical region: Secondary | ICD-10-CM | POA: Diagnosis not present

## 2020-03-28 DIAGNOSIS — M62830 Muscle spasm of back: Secondary | ICD-10-CM | POA: Diagnosis not present

## 2020-03-28 DIAGNOSIS — M9901 Segmental and somatic dysfunction of cervical region: Secondary | ICD-10-CM | POA: Diagnosis not present

## 2020-03-28 DIAGNOSIS — M5033 Other cervical disc degeneration, cervicothoracic region: Secondary | ICD-10-CM | POA: Diagnosis not present

## 2020-03-28 DIAGNOSIS — M9903 Segmental and somatic dysfunction of lumbar region: Secondary | ICD-10-CM | POA: Diagnosis not present

## 2020-04-04 DIAGNOSIS — M9903 Segmental and somatic dysfunction of lumbar region: Secondary | ICD-10-CM | POA: Diagnosis not present

## 2020-04-04 DIAGNOSIS — M9901 Segmental and somatic dysfunction of cervical region: Secondary | ICD-10-CM | POA: Diagnosis not present

## 2020-04-04 DIAGNOSIS — M62830 Muscle spasm of back: Secondary | ICD-10-CM | POA: Diagnosis not present

## 2020-04-04 DIAGNOSIS — M5033 Other cervical disc degeneration, cervicothoracic region: Secondary | ICD-10-CM | POA: Diagnosis not present

## 2020-04-11 DIAGNOSIS — M9901 Segmental and somatic dysfunction of cervical region: Secondary | ICD-10-CM | POA: Diagnosis not present

## 2020-04-11 DIAGNOSIS — M5033 Other cervical disc degeneration, cervicothoracic region: Secondary | ICD-10-CM | POA: Diagnosis not present

## 2020-04-11 DIAGNOSIS — M62830 Muscle spasm of back: Secondary | ICD-10-CM | POA: Diagnosis not present

## 2020-04-11 DIAGNOSIS — M9903 Segmental and somatic dysfunction of lumbar region: Secondary | ICD-10-CM | POA: Diagnosis not present

## 2020-04-19 DIAGNOSIS — M9903 Segmental and somatic dysfunction of lumbar region: Secondary | ICD-10-CM | POA: Diagnosis not present

## 2020-04-19 DIAGNOSIS — M5033 Other cervical disc degeneration, cervicothoracic region: Secondary | ICD-10-CM | POA: Diagnosis not present

## 2020-04-19 DIAGNOSIS — M62830 Muscle spasm of back: Secondary | ICD-10-CM | POA: Diagnosis not present

## 2020-04-19 DIAGNOSIS — M9901 Segmental and somatic dysfunction of cervical region: Secondary | ICD-10-CM | POA: Diagnosis not present

## 2020-05-11 DIAGNOSIS — M62830 Muscle spasm of back: Secondary | ICD-10-CM | POA: Diagnosis not present

## 2020-05-11 DIAGNOSIS — M9903 Segmental and somatic dysfunction of lumbar region: Secondary | ICD-10-CM | POA: Diagnosis not present

## 2020-05-11 DIAGNOSIS — M9901 Segmental and somatic dysfunction of cervical region: Secondary | ICD-10-CM | POA: Diagnosis not present

## 2020-05-11 DIAGNOSIS — M5033 Other cervical disc degeneration, cervicothoracic region: Secondary | ICD-10-CM | POA: Diagnosis not present

## 2020-05-13 DIAGNOSIS — G5601 Carpal tunnel syndrome, right upper limb: Secondary | ICD-10-CM | POA: Diagnosis not present

## 2020-05-13 DIAGNOSIS — R52 Pain, unspecified: Secondary | ICD-10-CM | POA: Diagnosis not present

## 2020-06-01 DIAGNOSIS — M62830 Muscle spasm of back: Secondary | ICD-10-CM | POA: Diagnosis not present

## 2020-06-01 DIAGNOSIS — M9901 Segmental and somatic dysfunction of cervical region: Secondary | ICD-10-CM | POA: Diagnosis not present

## 2020-06-01 DIAGNOSIS — M5033 Other cervical disc degeneration, cervicothoracic region: Secondary | ICD-10-CM | POA: Diagnosis not present

## 2020-06-01 DIAGNOSIS — M9903 Segmental and somatic dysfunction of lumbar region: Secondary | ICD-10-CM | POA: Diagnosis not present

## 2020-06-09 DIAGNOSIS — G5603 Carpal tunnel syndrome, bilateral upper limbs: Secondary | ICD-10-CM | POA: Diagnosis not present

## 2020-06-17 ENCOUNTER — Ambulatory Visit (INDEPENDENT_AMBULATORY_CARE_PROVIDER_SITE_OTHER): Payer: Medicare Other | Admitting: Psychology

## 2020-06-17 DIAGNOSIS — F411 Generalized anxiety disorder: Secondary | ICD-10-CM

## 2020-06-22 DIAGNOSIS — M9901 Segmental and somatic dysfunction of cervical region: Secondary | ICD-10-CM | POA: Diagnosis not present

## 2020-06-22 DIAGNOSIS — M9903 Segmental and somatic dysfunction of lumbar region: Secondary | ICD-10-CM | POA: Diagnosis not present

## 2020-06-22 DIAGNOSIS — M5033 Other cervical disc degeneration, cervicothoracic region: Secondary | ICD-10-CM | POA: Diagnosis not present

## 2020-06-22 DIAGNOSIS — M62830 Muscle spasm of back: Secondary | ICD-10-CM | POA: Diagnosis not present

## 2020-06-23 DIAGNOSIS — G5603 Carpal tunnel syndrome, bilateral upper limbs: Secondary | ICD-10-CM | POA: Diagnosis not present

## 2020-07-20 DIAGNOSIS — M9903 Segmental and somatic dysfunction of lumbar region: Secondary | ICD-10-CM | POA: Diagnosis not present

## 2020-07-20 DIAGNOSIS — M9901 Segmental and somatic dysfunction of cervical region: Secondary | ICD-10-CM | POA: Diagnosis not present

## 2020-07-20 DIAGNOSIS — M62830 Muscle spasm of back: Secondary | ICD-10-CM | POA: Diagnosis not present

## 2020-07-20 DIAGNOSIS — M5033 Other cervical disc degeneration, cervicothoracic region: Secondary | ICD-10-CM | POA: Diagnosis not present

## 2020-08-12 ENCOUNTER — Ambulatory Visit (INDEPENDENT_AMBULATORY_CARE_PROVIDER_SITE_OTHER): Payer: Medicare Other | Admitting: Psychology

## 2020-08-12 DIAGNOSIS — F411 Generalized anxiety disorder: Secondary | ICD-10-CM

## 2020-08-17 DIAGNOSIS — M5033 Other cervical disc degeneration, cervicothoracic region: Secondary | ICD-10-CM | POA: Diagnosis not present

## 2020-08-17 DIAGNOSIS — M9903 Segmental and somatic dysfunction of lumbar region: Secondary | ICD-10-CM | POA: Diagnosis not present

## 2020-08-17 DIAGNOSIS — M62830 Muscle spasm of back: Secondary | ICD-10-CM | POA: Diagnosis not present

## 2020-08-17 DIAGNOSIS — M9901 Segmental and somatic dysfunction of cervical region: Secondary | ICD-10-CM | POA: Diagnosis not present

## 2020-08-26 ENCOUNTER — Ambulatory Visit (INDEPENDENT_AMBULATORY_CARE_PROVIDER_SITE_OTHER): Payer: Medicare Other | Admitting: Psychology

## 2020-08-26 DIAGNOSIS — F411 Generalized anxiety disorder: Secondary | ICD-10-CM

## 2020-09-09 ENCOUNTER — Ambulatory Visit (INDEPENDENT_AMBULATORY_CARE_PROVIDER_SITE_OTHER): Payer: Medicare Other | Admitting: Psychology

## 2020-09-09 DIAGNOSIS — F411 Generalized anxiety disorder: Secondary | ICD-10-CM

## 2020-09-14 DIAGNOSIS — M9901 Segmental and somatic dysfunction of cervical region: Secondary | ICD-10-CM | POA: Diagnosis not present

## 2020-09-14 DIAGNOSIS — M9903 Segmental and somatic dysfunction of lumbar region: Secondary | ICD-10-CM | POA: Diagnosis not present

## 2020-09-14 DIAGNOSIS — M5033 Other cervical disc degeneration, cervicothoracic region: Secondary | ICD-10-CM | POA: Diagnosis not present

## 2020-09-14 DIAGNOSIS — M62830 Muscle spasm of back: Secondary | ICD-10-CM | POA: Diagnosis not present

## 2020-10-12 DIAGNOSIS — M9901 Segmental and somatic dysfunction of cervical region: Secondary | ICD-10-CM | POA: Diagnosis not present

## 2020-10-12 DIAGNOSIS — M9903 Segmental and somatic dysfunction of lumbar region: Secondary | ICD-10-CM | POA: Diagnosis not present

## 2020-10-12 DIAGNOSIS — M5033 Other cervical disc degeneration, cervicothoracic region: Secondary | ICD-10-CM | POA: Diagnosis not present

## 2020-10-12 DIAGNOSIS — M62830 Muscle spasm of back: Secondary | ICD-10-CM | POA: Diagnosis not present

## 2020-10-20 ENCOUNTER — Other Ambulatory Visit: Payer: Self-pay | Admitting: Primary Care

## 2020-10-20 DIAGNOSIS — E7800 Pure hypercholesterolemia, unspecified: Secondary | ICD-10-CM

## 2020-10-24 ENCOUNTER — Telehealth: Payer: Self-pay | Admitting: *Deleted

## 2020-10-24 NOTE — Telephone Encounter (Signed)
Agree with PCP appt and ER precautions

## 2020-10-24 NOTE — Telephone Encounter (Signed)
Spoke to patient by telephone and was advised that she has had SOB off and on for a week. Patient denies chest pain, fever, body aches. Patient stated that she feels better now than earlier when she called and talked with access nurse. Patient stated that she does not feel bad. Patient denies cough or congestion and stated that she has not been around anyone with covid that she knows of. Patient stated that she does have anxiety. Patient stated that she will do a home covid test to make sure that she is not positive for covid. Patient stated that she does not feel that she needs to go to an UC or ER at this point. Patient was advised that we can get her in with another provider tomorrow or Allie Bossier NP Wednesday. Patient stated that she would rather wait and see Anda Kraft. Patient scheduled an appointment with Anda Kraft 10/26/20 at 8:00 and is aware that she is at [facility]. Patient was given ER precautions and she stated if her symptoms get worse she will definitely go to the ER.

## 2020-10-24 NOTE — Telephone Encounter (Signed)
PLEASE NOTE: All timestamps contained within this report are represented as [place] Standard Time. CONFIDENTIALTY NOTICE: This fax transmission is intended only for the addressee. It contains information that is legally privileged, confidential or otherwise protected from use or disclosure. If you are not the intended recipient, you are strictly prohibited from reviewing, disclosing, copying using or disseminating any of this information or taking any action in reliance on or regarding this information. If you have received this fax in error, please notify us immediately by telephone so that we can arrange for its return to [place]. Phone: [phone], Toll-Free: [phone], Fax: [phone] Page: 1 of 2 Call Id: [id number] [place] [facility] - Client TELEPHONE ADVICE RECORD AccessNurse Patient Name: Samantha Diaz Gender: Female DOB: 1955/12/04 Age: 65 Y 27 M 10 D Return Phone Number: [phone] (Primary) Address: City/ State/ Zip: [place]  [address] Client [place] [facility] - Client Client Site [place] - Day Contact Type Call Who Is Calling Patient / Member / Family / Caregiver Call Type Triage / Clinical Relationship To Patient Self Return Phone Number [phone] (Primary) Chief Complaint BREATHING - shortness of breath or sounds breathless Reason for Call Symptomatic / Request for [place] states she's having some shortness of breath. In her chest it feels like her bra is too tight feeling, she has been on a lot of stress. Translation No Nurse Assessment Nurse: Hardin Negus, RN, Mardene Celeste Date/Time Eilene Ghazi Time): 10/24/2020 9:58:01 AM Confirm and document reason for call. If symptomatic, describe symptoms. ---Caller states she's having some shortness of breath. In her chest it feels like her bra is too tight feeling, she has been on a lot of stress. She is still able  to do stuff. She is not having any chest pain, she feels like she cannot catch her breath but she can. no arm pain, no shoulder pain. The s+s have been going for about a week about the same today Does the patient have any new or worsening symptoms? ---Yes Will a triage be completed? ---Yes Related visit to physician within the last 2 weeks? ---Yes Does the PT have any chronic conditions? (i.e. diabetes, asthma, this includes High risk factors for pregnancy, etc.) ---Yes List chronic conditions. ---high cholesterol Is this a behavioral health or substance abuse call? ---No Guidelines Guideline Title Affirmed Question Affirmed Notes Nurse Date/Time (Eastern Time) Anxiety and Panic Attack MODERATE anxiety (e.g., persistent or frequent anxiety symptoms; interferes with sleep, school, or work) Hardin Negus, Therapist, sports, Mardene Celeste 10/24/2020 10:00:49 AM PLEASE NOTE: All timestamps contained within this report are represented as [place] Standard Time. CONFIDENTIALTY NOTICE: This fax transmission is intended only for the addressee. It contains information that is legally privileged, confidential or otherwise protected from use or disclosure. If you are not the intended recipient, you are strictly prohibited from reviewing, disclosing, copying using or disseminating any of this information or taking any action in reliance on or regarding this information. If you have received this fax in error, please notify us immediately by telephone so that we can arrange for its return to [place]. Phone: [phone], Toll-Free: [phone], Fax: [phone] Page: 2 of 2 Call Id: [id number] [place]. Time Eilene Ghazi Time) Disposition Final User 10/24/2020 9:54:17 AM Send to Urgent Antony Haste 10/24/2020 10:07:37 AM SEE PCP WITHIN 3 DAYS Yes Hardin Negus, RN, [place] Disagree/Comply Comply Caller Understands Yes PreDisposition Call Doctor Care Advice Given Per Guideline SEE PCP WITHIN 3 DAYS: CALL BACK IF: * You  feel like harming yourself * You become worse CARE ADVICE given per Anxiety and Panic Attack (Adult) guideline. Comments User: Ledora Bottcher, RN Date/Time Eilene Ghazi Time): 10/24/2020 10:00:38 AM anxiety Referrals REFERRED TO PCP OFFICE

## 2020-10-26 ENCOUNTER — Ambulatory Visit (INDEPENDENT_AMBULATORY_CARE_PROVIDER_SITE_OTHER): Payer: Medicare Other | Admitting: Primary Care

## 2020-10-26 ENCOUNTER — Other Ambulatory Visit: Payer: Self-pay

## 2020-10-26 ENCOUNTER — Encounter: Payer: Self-pay | Admitting: Primary Care

## 2020-10-26 VITALS — BP 120/62 | HR 74 | Temp 98.0°F | Ht 60.0 in | Wt 105.0 lb

## 2020-10-26 DIAGNOSIS — F419 Anxiety disorder, unspecified: Secondary | ICD-10-CM

## 2020-10-26 DIAGNOSIS — Z23 Encounter for immunization: Secondary | ICD-10-CM | POA: Diagnosis not present

## 2020-10-26 DIAGNOSIS — F32A Depression, unspecified: Secondary | ICD-10-CM | POA: Diagnosis not present

## 2020-10-26 NOTE — Progress Notes (Signed)
Subjective:    Patient ID: Samantha Diaz, female    DOB: [birth year], 65 y.o.   MRN: [id number]  HPI  Samantha Diaz is a very pleasant 65 y.o. female with a history of cardiac murmur, anxiety and depression, hyperlipidemia who presents today to discuss dyspnea.  Symptoms began about two weeks ago which mostly feels like mid chest pressure, feels that her bra is too tight, occurs with rest and movement. Symptoms are not relieved when she removes her bra. No pattern to shortness of breath symptoms, worse with stressful situations. She denies cough, chest pain, jaw pain, exposure to Covid-19.   She's been under a lot of stress over the last 6 months. She's been off and on her Zoloft over the year, resumed again about three weeks ago at the 12.5 mg dose. Historically has found Zoloft to be effective for anxiety and depression. Over the last three-four days her symptoms have improved. Last week she mowed two lawns and cleaned her mothers house and she did not experience dyspnea or chest pressure.    Review of Systems  Constitutional:  Negative for fever.  Eyes:  Negative for visual disturbance.  Respiratory:  Positive for chest tightness and shortness of breath. Negative for cough.        See HPI  Cardiovascular:  Negative for chest pain and palpitations.  Neurological:  Negative for headaches.  Psychiatric/Behavioral:  The patient is nervous/anxious.        See HPI        Past Medical History:  Diagnosis Date   History of UTI    Hyperlipidemia    Murmur     Social History   Socioeconomic History   Marital status: Married    Spouse name: Not on file   Number of children: Not on file   Years of education: Not on file   Highest education level: Not on file  Occupational History   Not on file  Tobacco Use   Smoking status: Former    Types: Cigarettes    Quit date: 01/16/2004    Years since quitting: 16.7   Smokeless tobacco: Never  Substance and Sexual Activity    Alcohol use: Yes    Alcohol/week: 0.0 standard drinks    Comment: rarely   Drug use: No   Sexual activity: Not on file  Other Topics Concern   Not on file  Social History Narrative   Married.   No children.   Enjoys reading, traveling to the mountains.   Works in Therapist, art.   Social Determinants of Health   Financial Resource Strain: Not on file  Food Insecurity: Not on file  Transportation Needs: Not on file  Physical Activity: Not on file  Stress: Not on file  Social Connections: Not on file  Intimate Partner Violence: Not on file    Past Surgical History:  Procedure Laterality Date   BREAST BIOPSY Left 2014   neg-Fibrocystic changes   CHOLECYSTECTOMY  2013    Family History  Problem Relation Age of Onset   Cancer Father        skin   Hyperlipidemia Father    Hypertension Mother    Cancer Maternal Grandfather        lung   Breast cancer Neg Hx     Allergies  Allergen Reactions   Sulfa Antibiotics Swelling    Tongue swelling   Latex Rash    Current Outpatient Medications on File Prior to Visit  Medication Sig Dispense  Refill   Calcium-Magnesium-Vitamin D (CALCIUM MAGNESIUM PO) Take by mouth.     Coenzyme Q10 (COQ10) 100 MG CAPS Take 1 capsule by mouth daily.     Echinacea 400 MG CAPS Take 1 capsule by mouth daily.     meloxicam (MOBIC) 15 MG tablet Take 15 mg by mouth daily.     sertraline (ZOLOFT) 25 MG tablet Take 12.5 mg by mouth daily.     simvastatin (ZOCOR) 40 MG tablet Take 1 tablet (40 mg total) by mouth daily. For cholesterol. Office visit required for further refills. 90 tablet 0   VITAMIN E PO      No current facility-administered medications on file prior to visit.    BP 120/62   Pulse 74   Temp 98 F (36.7 C) (Temporal)   Ht 5' (1.524 m)   Wt 105 lb (47.6 kg)   SpO2 98%   BMI 20.51 kg/m  Objective:   Physical Exam Constitutional:      General: She is not in acute distress.    Appearance: She is not ill-appearing.   Cardiovascular:     Rate and Rhythm: Normal rate and regular rhythm.  Pulmonary:     Effort: Pulmonary effort is normal.     Breath sounds: Normal breath sounds.  Musculoskeletal:     Cervical back: Neck supple.  Skin:    General: Skin is warm and dry.          Assessment & Plan:      This visit occurred during the SARS-CoV-2 public health emergency.  Safety protocols were in place, including screening questions prior to the visit, additional usage of staff PPE, and extensive cleaning of exam room while observing appropriate contact time as indicated for disinfecting solutions.

## 2020-10-26 NOTE — Assessment & Plan Note (Addendum)
Uncontrolled. Suspect symptoms today are secondary to increased stress level. Patient agrees. Exam benign.   GAD 7 score has increased from 9 to 13 since 2018.  Increase dose to 25 mg Zoloft daily.  She will update if symptoms do not continue to improve.   I evaluated patient, was consulted regarding treatment, and agree with assessment and plan per Budd Palmer, RN, DNP student.   Allie Bossier, NP-C

## 2020-10-26 NOTE — Patient Instructions (Addendum)
It was great to see you today! Please increase Zoloft to 25 mg (1 tablet) daily.  Please update [place] with any concerns.

## 2020-10-26 NOTE — Progress Notes (Signed)
Established Patient Office Visit  Subjective:  Patient ID: Samantha Diaz, female    DOB: 15-May-1955  Age: 65 y.o. MRN: [id number]  CC: No chief complaint on file.   HPI Samantha Diaz is a 65 year old female with a past medical history of anxiety and depression, cardiac murmer, osteoporosis, and hypercholesteremia presents for shortness of breath. Onset was approximately 2 week ago, is intermittent and increased in frequency and severity last week. It is better yesterday and today. She cannot discern a pattern and is not worse with activity. She describes it as pressure and feels like her bra is too tight circumferentially, but the sensation does not go away when she removes her bra. She is able to speak in full sentences without dyspnea. She denies chest pain, fever, chills, body aches, or congestion.  She has had 2 Janssen COVID vaccines and has not been exposed to anyone with COVID or other URI symptoms.   She has felt more stressed than normal due to care-taking for her mother and the death of of her brother in July 30, 2022. Her Zoloft use has been fluctuant. She stopped for several months and resumed it in 30-Jul-2022 after the death of her brother, but stopped again soon after. She recently started taking it regularly again 3 weeks ago. Zoloft has been effective for her in the past at 12.5 mg daily.  Purposeful calming techniques such as crossword puzzles and the distraction of working helps to alleviate the discomfort. GAD 7 score has increased since 2018.  GAD 7 : Generalized Anxiety Score 10/26/2020 12/12/2016  Nervous, Anxious, on Edge 2 2  Control/stop worrying 2 1  Worry too much - different things 2 3  Trouble relaxing 2 2  Restless 3 0  Easily annoyed or irritable 1 1  Afraid - awful might happen 1 0  Total GAD 7 Score 13 9  Anxiety Difficulty Somewhat difficult Somewhat difficult               Past Medical History:  Diagnosis Date   History of UTI     Hyperlipidemia    Murmur     Past Surgical History:  Procedure Laterality Date   BREAST BIOPSY Left 2014   neg-Fibrocystic changes   CHOLECYSTECTOMY  2013    Family History  Problem Relation Age of Onset   Cancer Father        skin   Hyperlipidemia Father    Hypertension Mother    Cancer Maternal Grandfather        lung   Breast cancer Neg Hx     Social History   Socioeconomic History   Marital status: Married    Spouse name: Not on file   Number of children: Not on file   Years of education: Not on file   Highest education level: Not on file  Occupational History   Not on file  Tobacco Use   Smoking status: Former    Types: Cigarettes    Quit date: 01/16/2004    Years since quitting: 16.7   Smokeless tobacco: Never  Substance and Sexual Activity   Alcohol use: Yes    Alcohol/week: 0.0 standard drinks    Comment: rarely   Drug use: No   Sexual activity: Not on file  Other Topics Concern   Not on file  Social History Narrative   Married.   No children.   Enjoys reading, traveling to the mountains.   Works in Therapist, art.   Social Determinants  of Health   Financial Resource Strain: Not on file  Food Insecurity: Not on file  Transportation Needs: Not on file  Physical Activity: Not on file  Stress: Not on file  Social Connections: Not on file  Intimate Partner Violence: Not on file    Outpatient Medications Prior to Visit  Medication Sig Dispense Refill   Coenzyme Q10 (COQ10) 100 MG CAPS Take 1 capsule by mouth daily.     Echinacea 400 MG CAPS Take 1 capsule by mouth daily.     simvastatin (ZOCOR) 40 MG tablet Take 1 tablet (40 mg total) by mouth daily. For cholesterol. Office visit required for further refills. 90 tablet 0   VITAMIN E PO      No facility-administered medications prior to visit.    Allergies  Allergen Reactions   Sulfa Antibiotics Swelling    Tongue swelling   Latex Rash    ROS Review of Systems  Constitutional:   Negative for diaphoresis, fatigue and fever.  HENT:  Negative for congestion and sore throat.   Respiratory:  Negative for cough, chest tightness, wheezing and stridor.        Chest "pressure"   Cardiovascular:  Negative for palpitations.  Gastrointestinal: Negative.   Neurological:  Negative for syncope and light-headedness.  Psychiatric/Behavioral:  The patient is nervous/anxious.        Feels increased level of stress     Objective:    Physical Exam Constitutional:      Appearance: Normal appearance.  Cardiovascular:     Rate and Rhythm: Normal rate and regular rhythm.     Heart sounds: Murmur heard.  Pulmonary:     Effort: Pulmonary effort is normal.     Breath sounds: Normal breath sounds.  Skin:    General: Skin is warm and dry.  Neurological:     General: No focal deficit present.     Mental Status: She is alert and oriented to person, place, and time. Mental status is at baseline.  Psychiatric:        Behavior: Behavior normal.    There were no vitals taken for this visit. Wt Readings from Last 3 Encounters:  01/01/20 103 lb 12.8 oz (47.1 kg)  12/31/18 94 lb 8 oz (42.9 kg)  07/11/18 94 lb 4 oz (42.8 kg)     Health Maintenance Due  Topic Date Due   Zoster Vaccines- Shingrix (1 of 2) Never done   COVID-19 Vaccine (3 - Booster for Janssen series) 04/03/2020   INFLUENZA VACCINE  08/15/2020    There are no preventive care reminders to display for this patient.  No results found for: TSH Lab Results  Component Value Date   WBC 6.3 12/29/2018   HGB 13.7 12/29/2018   HCT 40.9 12/29/2018   MCV 93.0 12/29/2018   PLT 257.0 12/29/2018   Lab Results  Component Value Date   NA 139 12/25/2019   K 4.1 12/25/2019   CO2 26 12/25/2019   GLUCOSE 92 12/25/2019   BUN 15 12/25/2019   CREATININE 0.72 12/25/2019   BILITOT 0.7 12/25/2019   ALKPHOS 59 12/25/2019   AST 19 12/25/2019   ALT 18 12/25/2019   PROT 7.3 12/25/2019   ALBUMIN 4.5 12/25/2019   CALCIUM 9.5  12/25/2019   GFR 87.01 12/25/2019   Lab Results  Component Value Date   CHOL 205 (H) 12/25/2019   Lab Results  Component Value Date   HDL 69.30 12/25/2019   Lab Results  Component Value Date  [place] 110 (H) 12/25/2019   Lab Results  Component Value Date   TRIG 126.0 12/25/2019   Lab Results  Component Value Date   CHOLHDL 3 12/25/2019   Lab Results  Component Value Date   HGBA1C 5.6 12/25/2019      Assessment & Plan:   Problem List Items Addressed This Visit   None   No orders of the defined types were placed in this encounter.   Follow-up: No follow-ups on file.    Ninfa Meeker, RN

## 2020-11-09 DIAGNOSIS — M62830 Muscle spasm of back: Secondary | ICD-10-CM | POA: Diagnosis not present

## 2020-11-09 DIAGNOSIS — M9903 Segmental and somatic dysfunction of lumbar region: Secondary | ICD-10-CM | POA: Diagnosis not present

## 2020-11-09 DIAGNOSIS — M5033 Other cervical disc degeneration, cervicothoracic region: Secondary | ICD-10-CM | POA: Diagnosis not present

## 2020-11-09 DIAGNOSIS — M9901 Segmental and somatic dysfunction of cervical region: Secondary | ICD-10-CM | POA: Diagnosis not present

## 2020-11-25 ENCOUNTER — Other Ambulatory Visit: Payer: Self-pay | Admitting: Primary Care

## 2020-11-25 DIAGNOSIS — Z1231 Encounter for screening mammogram for malignant neoplasm of breast: Secondary | ICD-10-CM

## 2020-12-07 DIAGNOSIS — M9901 Segmental and somatic dysfunction of cervical region: Secondary | ICD-10-CM | POA: Diagnosis not present

## 2020-12-07 DIAGNOSIS — M9903 Segmental and somatic dysfunction of lumbar region: Secondary | ICD-10-CM | POA: Diagnosis not present

## 2020-12-07 DIAGNOSIS — M5033 Other cervical disc degeneration, cervicothoracic region: Secondary | ICD-10-CM | POA: Diagnosis not present

## 2020-12-07 DIAGNOSIS — M62830 Muscle spasm of back: Secondary | ICD-10-CM | POA: Diagnosis not present

## 2020-12-16 ENCOUNTER — Ambulatory Visit
Admission: RE | Admit: 2020-12-16 | Discharge: 2020-12-16 | Disposition: A | Discharge status: Home / Self Care | Payer: Medicare Other | Source: Ambulatory Visit | Attending: Primary Care | Admitting: Primary Care

## 2020-12-16 ENCOUNTER — Other Ambulatory Visit: Payer: Self-pay

## 2020-12-16 DIAGNOSIS — Z1231 Encounter for screening mammogram for malignant neoplasm of breast: Secondary | ICD-10-CM | POA: Insufficient documentation

## 2020-12-20 ENCOUNTER — Other Ambulatory Visit: Payer: Self-pay | Admitting: Primary Care

## 2020-12-20 DIAGNOSIS — R928 Other abnormal and inconclusive findings on diagnostic imaging of breast: Secondary | ICD-10-CM

## 2020-12-20 DIAGNOSIS — N6320 Unspecified lump in the left breast, unspecified quadrant: Secondary | ICD-10-CM

## 2021-01-02 ENCOUNTER — Ambulatory Visit
Admission: RE | Admit: 2021-01-02 | Discharge: 2021-01-02 | Disposition: A | Discharge status: Home / Self Care | Payer: Medicare Other | Source: Ambulatory Visit | Attending: Primary Care | Admitting: Primary Care

## 2021-01-02 ENCOUNTER — Other Ambulatory Visit: Payer: Self-pay

## 2021-01-02 DIAGNOSIS — R928 Other abnormal and inconclusive findings on diagnostic imaging of breast: Secondary | ICD-10-CM | POA: Insufficient documentation

## 2021-01-02 DIAGNOSIS — N6320 Unspecified lump in the left breast, unspecified quadrant: Secondary | ICD-10-CM

## 2021-01-02 DIAGNOSIS — N6002 Solitary cyst of left breast: Secondary | ICD-10-CM | POA: Diagnosis not present

## 2021-01-02 DIAGNOSIS — R922 Inconclusive mammogram: Secondary | ICD-10-CM | POA: Diagnosis not present

## 2021-01-04 DIAGNOSIS — M9903 Segmental and somatic dysfunction of lumbar region: Secondary | ICD-10-CM | POA: Diagnosis not present

## 2021-01-04 DIAGNOSIS — M62830 Muscle spasm of back: Secondary | ICD-10-CM | POA: Diagnosis not present

## 2021-01-04 DIAGNOSIS — M9901 Segmental and somatic dysfunction of cervical region: Secondary | ICD-10-CM | POA: Diagnosis not present

## 2021-01-04 DIAGNOSIS — M5033 Other cervical disc degeneration, cervicothoracic region: Secondary | ICD-10-CM | POA: Diagnosis not present

## 2021-01-04 NOTE — Progress Notes (Signed)
Subjective:   Samantha Diaz is a 65 y.o. female who presents for Medicare Annual (Subsequent) preventive examination.  I connected with Samantha Diaz today by telephone and verified that I am speaking with the correct person using two identifiers. Location patient: home Location provider: work Persons participating in the virtual visit: patient, Marine scientist.    I discussed the limitations, risks, security and privacy concerns of performing an evaluation and management service by telephone and the availability of in person appointments. I also discussed with the patient that there may be a patient responsible charge related to this service. The patient expressed understanding and verbally consented to this telephonic visit.    Interactive audio and video telecommunications were attempted between this provider and patient, however failed, due to patient having technical difficulties OR patient did not have access to video capability.  We continued and completed visit with audio only.  Some vital signs may be absent or patient reported.   Time Spent with patient on telephone encounter: 30 minutes  Review of Systems     Cardiac Risk Factors include: advanced age (>23men, >10 women)     Objective:    Today's Vitals   01/05/21 1448  Weight: 105 lb (47.6 kg)  Height: 5' (1.524 m)   Body mass index is 20.51 kg/m.  Advanced Directives 01/05/2021  Does Patient Have a Medical Advance Directive? Yes  Type of Paramedic of Samantha Diaz;Living will  Does patient want to make changes to medical advance directive? Yes (MAU/Ambulatory/Procedural Areas - Information given)    Current Medications (verified) Outpatient Encounter Medications as of 01/05/2021  Medication Sig   Calcium-Magnesium-Vitamin D (CALCIUM MAGNESIUM PO) Take by mouth.   Coenzyme Q10 (COQ10) 100 MG CAPS Take 1 capsule by mouth daily.   Echinacea 400 MG CAPS Take 1 capsule by mouth daily.    meloxicam (MOBIC) 15 MG tablet Take 15 mg by mouth daily.   sertraline (ZOLOFT) 25 MG tablet Take 25 mg by mouth daily.   simvastatin (ZOCOR) 40 MG tablet Take 1 tablet (40 mg total) by mouth daily. For cholesterol. Office visit required for further refills.   VITAMIN E PO    No facility-administered encounter medications on file as of 01/05/2021.    Allergies (verified) Sulfa antibiotics and Latex   History: Past Medical History:  Diagnosis Date   History of UTI    Hyperlipidemia    Murmur    Past Surgical History:  Procedure Laterality Date   BREAST BIOPSY Left 2014   neg-Fibrocystic changes   CHOLECYSTECTOMY  2013   Family History  Problem Relation Age of Onset   Cancer Father        skin   Hyperlipidemia Father    Hypertension Mother    Cancer Maternal Grandfather        lung   Breast cancer Neg Hx    Social History   Socioeconomic History   Marital status: Married    Spouse name: Not on file   Number of children: Not on file   Years of education: Not on file   Highest education level: Not on file  Occupational History   Not on file  Tobacco Use   Smoking status: Former    Types: Cigarettes    Quit date: 01/16/2004    Years since quitting: 16.9   Smokeless tobacco: Never  Substance and Sexual Activity   Alcohol use: Yes    Alcohol/week: 0.0 standard drinks    Comment: rarely  Drug use: No   Sexual activity: Not on file  Other Topics Concern   Not on file  Social History Narrative   Married.   No children.   Enjoys reading, traveling to the mountains.   Works in Therapist, art.   Social Determinants of Health   Financial Resource Strain: Low Risk    Difficulty of Paying Living Expenses: Not hard at all  Food Insecurity: No Food Insecurity   Worried About Charity fundraiser in the Last Year: Never true   [place] in the Last Year: Never true  Transportation Needs: No Transportation Needs   Lack of Transportation (Medical): No   Lack  of Transportation (Non-Medical): No  Physical Activity: Inactive   Days of Exercise per Week: 0 days   Minutes of Exercise per Session: 0 min  Stress: No Stress Concern Present   Feeling of Stress : Only a little  Social Connections: Moderately Isolated   Frequency of Communication with Friends and Family: Once a week   Frequency of Social Gatherings with Friends and Family: More than three times a week   Attends Religious Services: Never   Marine scientist or Organizations: No   Attends Music therapist: Never   Marital Status: Married    Tobacco Counseling Counseling given: Not Answered   Clinical Intake:  Pre-visit preparation completed: Yes  Pain : No/denies pain     BMI - recorded: 20.51 Nutritional Status: BMI of 19-24  Normal Nutritional Risks: None Diabetes: No  How often do you need to have someone help you when you read instructions, pamphlets, or other written materials from your doctor or pharmacy?: 1 - Never  Diabetic? No  Interpreter Needed?: No  Information entered by :: Samantha Brigham LPN   Activities of Daily Living In your present state of health, do you have any difficulty performing the following activities: 01/05/2021 10/26/2020  Hearing? N N  Vision? Y N  Difficulty concentrating or making decisions? N N  Walking or climbing stairs? N N  Dressing or bathing? N N  Doing errands, shopping? N N  Preparing Food and eating ? N -  Using the Toilet? N -  In the past six months, have you accidently leaked urine? N -  Do you have problems with loss of bowel control? N -  Managing your Medications? N -  Managing your Finances? N -  Housekeeping or managing your Housekeeping? N -  Some recent data might be hidden    Patient Care Team: Pleas Koch, NP as PCP - General (Nurse Practitioner) Robert Bellow, MD as Consulting Physician (General Surgery)  Indicate any recent Medical Services you may have received from  other than [facility] providers in the past year (date may be approximate).     Assessment:   This is a routine wellness examination for [name].  Hearing/Vision screen Hearing Screening - Comments:: No issues  Vision Screening - Comments:: Last exam 1-2 years ago, plans to make an appointment, Dr. Matilde Sprang  , wears glasses  Dietary issues and exercise activities discussed: Current Exercise Habits: The patient does not participate in regular exercise at present   Goals Addressed             This Visit's Progress    Patient Stated       Would like to maintain current routine.        Depression Screen PHQ 2/9 Scores 01/05/2021 10/26/2020 01/01/2020 12/31/2018 12/12/2016  PHQ -  2 Score 1 1 0 2 1  PHQ- 9 Score - 4 - - 11    Fall Risk Fall Risk  01/05/2021 01/01/2020 12/31/2018  Falls in the past year? 0 0 0  Number falls in past yr: 0 - 0  Injury with Fall? 0 - 0  Risk for fall due to : No Fall Risks - -  Follow up Falls prevention discussed - -    FALL [place]:  Any stairs in or around the home? No  If so, are there any without handrails? No  Home free of loose throw rugs in walkways, pet beds, electrical cords, etc? Yes  Adequate lighting in your home to reduce risk of falls? Yes   ASSISTIVE DEVICES UTILIZED TO PREVENT FALLS:  Diaz alert? No  Use of a cane, walker or w/c? No  Grab bars in the bathroom? Yes  Shower chair or bench in shower? Yes  Elevated toilet seat or a handicapped toilet? No   TIMED UP AND GO:  Was the test performed? No , visit completed over the phone.     Cognitive Function:    Normal cognitive status assessed by  this Nurse Health Advisor. No abnormalities found.      Immunizations Immunization History  Administered Date(s) Administered   Fluad Quad(high Dose 65+) 01/01/2020, 10/26/2020   Influenza Inj Mdck Quad Pf 11/09/2017   Influenza,inj,Quad PF,6+ Mos 12/12/2015, 11/10/2016, 11/02/2018    Influenza-Unspecified 11/10/2016   Janssen (J&J) SARS-COV-2 Vaccination 04/27/2019, 12/05/2019   Pneumococcal Conjugate-13 12/31/2018   Pneumococcal Polysaccharide-23 01/01/2020   Tdap 01/18/2017   Zoster, Live 11/03/2015    TDAP status: Up to date  Flu Vaccine status: Up to date  Pneumococcal vaccine status: Up to date  Covid-19 vaccine status: Information provided on how to obtain vaccines.   Qualifies for Shingles Vaccine? Yes   Zostavax completed Yes   Shingrix Completed?: No.    Education has been provided regarding the importance of this vaccine. Patient has been advised to call insurance company to determine out of pocket expense if they have not yet received this vaccine. Advised may also receive vaccine at local pharmacy or Health Dept. Verbalized acceptance and understanding.  Screening Tests Health Maintenance  Topic Date Due   Zoster Vaccines- Shingrix (1 of 2) Never done   COVID-19 Vaccine (3 - Booster for Janssen series) 01/30/2020   Fecal DNA (Cologuard)  05/08/2022   MAMMOGRAM  12/17/2022   TETANUS/TDAP  01/19/2027   Pneumonia Vaccine 4+ Years old  Completed   INFLUENZA VACCINE  Completed   DEXA SCAN  Completed   Hepatitis C Screening  Completed   HPV VACCINES  Aged Out    Health Maintenance  Health Maintenance Due  Topic Date Due   Zoster Vaccines- Shingrix (1 of 2) Never done   COVID-19 Vaccine (3 - Booster for Janssen series) 01/30/2020    Colorectal cancer screening: Type of screening: Cologuard. Completed 05/08/22. Repeat every 3 years  Mammogram status: Completed 12/16/20. Repeat every year  Bone Density status: Completed 02/24/19. Results reflect: Bone density results: OSTEOPOROSIS. Repeat every 2 years.  Lung Cancer Screening: (Low Dose CT Chest recommended if Age 61-80 years, 30 pack-year currently smoking OR have quit w/in 15years.) does not qualify.     Additional Screening:  Hepatitis C Screening: does qualify; Completed  07/04/18  Vision Screening: Recommended annual ophthalmology exams for early detection of glaucoma and other disorders of the eye. Is the patient up to date  with their annual eye exam?  No  Who is the provider or what is the name of the office in which the patient attends annual eye exams? Dr. Matilde Sprang   Dental Screening: Recommended annual dental exams for proper oral hygiene  Community Resource Referral / Chronic Care Management: CRR required this visit?  No   CCM required this visit?  No      Plan:     I have personally reviewed and noted the following in the patients chart:   Medical and social history Use of alcohol, tobacco or illicit drugs  Current medications and supplements including opioid prescriptions.  Functional ability and status Nutritional status Physical activity Advanced directives List of other physicians Hospitalizations, surgeries, and ER visits in previous 12 months Vitals Screenings to include cognitive, depression, and falls Referrals and appointments  In addition, I have reviewed and discussed with patient certain preventive protocols, quality metrics, and best practice recommendations. A written personalized care plan for preventive services as well as general preventive health recommendations were provided to patient.   Due to this being a telephonic visit, the after visit summary with patients personalized plan was offered to patient via mail or my-chart. Patient would like to access on my-chart    Loma Messing, LPN   [id number]   Nurse Health Advisor  Nurse Notes: none

## 2021-01-05 ENCOUNTER — Ambulatory Visit (INDEPENDENT_AMBULATORY_CARE_PROVIDER_SITE_OTHER): Payer: Medicare Other

## 2021-01-05 VITALS — Ht 60.0 in | Wt 105.0 lb

## 2021-01-05 DIAGNOSIS — Z0000 Encounter for general adult medical examination without abnormal findings: Secondary | ICD-10-CM

## 2021-01-05 NOTE — Patient Instructions (Addendum)
Samantha Diaz , Thank you for taking time to complete  your Medicare Wellness Visit. I appreciate your ongoing commitment to your health goals. Please review the following plan we discussed and let me know if I can assist you in the future.   Screening recommendations/referrals: Colonoscopy: up to date, completed 05/08/19 due 05/08/22 Mammogram: up to date, completed 12/16/20, due 12/16/21 Bone Density: up to date, completed 02/24/19, due 02/23/21 Recommended yearly ophthalmology/optometry visit for glaucoma screening and checkup Recommended yearly dental visit for hygiene and checkup  Vaccinations: Influenza vaccine: up to date Pneumococcal vaccine: up to date Tdap vaccine: up to date, completed 01/18/17, due 01/19/27 Shingles vaccine: Discuss with your local pharmacy Covid-19: newest booster available at your local pharmacy   Advanced directives: .Please bring a copy of Living Will and/or [place] for your chart.   Conditions/risks identified: see problem list  Next appointment: Follow up in one year for your annual wellness visit    Preventive Care 65 Years and Older, Female Preventive care refers to lifestyle choices and visits with your health care provider that can promote health and wellness. What does preventive care include? A yearly physical exam. This is also called an annual well check. Dental exams once or twice a year. Routine eye exams. Ask your health care provider how often you should have your eyes checked. Personal lifestyle choices, including: Daily care of your teeth and gums. Regular physical activity. Eating a healthy diet. Avoiding tobacco and drug use. Limiting alcohol use. Practicing safe sex. Taking low-dose aspirin every day. Taking vitamin and mineral supplements as recommended by your health care provider. What happens during an annual well check? The services and screenings done by your health care provider during your annual well check  will depend on your age, overall health, lifestyle risk factors, and family history of disease. Counseling  Your health care provider may ask you questions about your: Alcohol use. Tobacco use. Drug use. Emotional well-being. Home and relationship well-being. Sexual activity. Eating habits. History of falls. Memory and ability to understand (cognition). Work and work Statistician. Reproductive health. Screening  You may have the following tests or measurements: Height, weight, and BMI. Blood pressure. Lipid and cholesterol levels. These may be checked every 5 years, or more frequently if you are over 33 years old. Skin check. Lung cancer screening. You may have this screening every year starting at age 74 if you have a 30-pack-year history of smoking and currently smoke or have quit within the past 15 years. Fecal occult blood test (FOBT) of the stool. You may have this test every year starting at age 83. Flexible sigmoidoscopy or colonoscopy. You may have a sigmoidoscopy every 5 years or a colonoscopy every 10 years starting at age 65. Hepatitis C blood test. Hepatitis B blood test. Sexually transmitted disease (STD) testing. Diabetes screening. This is done by checking your blood sugar (glucose) after you have not eaten for a while (fasting). You may have this done every 1-3 years. Bone density scan. This is done to screen for osteoporosis. You may have this done starting at age 78. Mammogram. This may be done every 1-2 years. Talk to your health care provider about how often you should have regular mammograms. Talk with your health care provider about your test results, treatment options, and if necessary, the need for more tests. Vaccines  Your health care provider may recommend certain vaccines, such as: Influenza vaccine. This is recommended every year. Tetanus, diphtheria, and acellular pertussis (Tdap,  Td) vaccine. You may need a Td booster every 10 years. Zoster vaccine. You  may need this after age 61. Pneumococcal 13-valent conjugate (PCV13) vaccine. One dose is recommended after age 37. Pneumococcal polysaccharide (PPSV23) vaccine. One dose is recommended after age 58. Talk to your health care provider about which screenings and vaccines you need and how often you need them. This information is not intended to replace advice given to you by your health care provider. Make sure you discuss any questions you have with your health care provider. Document Released: 01/28/2015 Document Revised: 09/21/2015 Document Reviewed: 11/02/2014 Elsevier Interactive Patient Education  2017 [employer] Prevention in the Home Falls can cause injuries. They can happen to people of all ages. There are many things you can do to make your home safe and to help prevent falls. What can I do on the outside of my home? Regularly fix the edges of walkways and driveways and fix any cracks. Remove anything that might make you trip as you walk through a door, such as a raised step or threshold. Trim any bushes or trees on the path to your home. Use bright outdoor lighting. Clear any walking paths of anything that might make someone trip, such as rocks or tools. Regularly check to see if handrails are loose or broken. Make sure that both sides of any steps have handrails. Any raised decks and porches should have guardrails on the edges. Have any leaves, snow, or ice cleared regularly. Use sand or salt on walking paths during winter. Clean up any spills in your garage right away. This includes oil or grease spills. What can I do in the bathroom? Use night lights. Install grab bars by the toilet and in the tub and shower. Do not use towel bars as grab bars. Use non-skid mats or decals in the tub or shower. If you need to sit down in the shower, use a plastic, non-slip stool. Keep the floor dry. Clean up any water that spills on the floor as soon as it happens. Remove soap buildup  in the tub or shower regularly. Attach bath mats securely with double-sided non-slip rug tape. Do not have throw rugs and other things on the floor that can make you trip. What can I do in the bedroom? Use night lights. Make sure that you have a light by your bed that is easy to reach. Do not use any sheets or blankets that are too big for your bed. They should not hang down onto the floor. Have a firm chair that has side arms. You can use this for support while you get dressed. Do not have throw rugs and other things on the floor that can make you trip. What can I do in the kitchen? Clean up any spills right away. Avoid walking on wet floors. Keep items that you use a lot in easy-to-reach places. If you need to reach something above you, use a strong step stool that has a grab bar. Keep electrical cords out of the way. Do not use floor polish or wax that makes floors slippery. If you must use wax, use non-skid floor wax. Do not have throw rugs and other things on the floor that can make you trip. What can I do with my stairs? Do not leave any items on the stairs. Make sure that there are handrails on both sides of the stairs and use them. Fix handrails that are broken or loose. Make sure that handrails are as  long as the stairways. Check any carpeting to make sure that it is firmly attached to the stairs. Fix any carpet that is loose or worn. Avoid having throw rugs at the top or bottom of the stairs. If you do have throw rugs, attach them to the floor with carpet tape. Make sure that you have a light switch at the top of the stairs and the bottom of the stairs. If you do not have them, ask someone to add them for you. What else can I do to help prevent falls? Wear shoes that: Do not have high heels. Have rubber bottoms. Are comfortable and fit you well. Are closed at the toe. Do not wear sandals. If you use a stepladder: Make sure that it is fully opened. Do not climb a closed  stepladder. Make sure that both sides of the stepladder are locked into place. Ask someone to hold it for you, if possible. Clearly mark and make sure that you can see: Any grab bars or handrails. First and last steps. Where the edge of each step is. Use tools that help you move around (mobility aids) if they are needed. These include: Canes. Walkers. Scooters. Crutches. Turn on the lights when you go into a dark area. Replace any light bulbs as soon as they burn out. Set up your furniture so you have a clear path. Avoid moving your furniture around. If any of your floors are uneven, fix them. If there are any pets around you, be aware of where they are. Review your medicines with your doctor. Some medicines can make you feel dizzy. This can increase your chance of falling. Ask your doctor what other things that you can do to help prevent falls. This information is not intended to replace advice given to you by your health care provider. Make sure you discuss any questions you have with your health care provider. Document Released: 10/28/2008 Document Revised: 06/09/2015 Document Reviewed: 02/05/2014 Elsevier Interactive Patient Education  2017 [employer].

## 2021-01-13 ENCOUNTER — Ambulatory Visit: Payer: 59

## 2021-01-18 ENCOUNTER — Other Ambulatory Visit: Payer: Self-pay | Admitting: Primary Care

## 2021-01-18 DIAGNOSIS — F32A Depression, unspecified: Secondary | ICD-10-CM

## 2021-01-18 DIAGNOSIS — E7800 Pure hypercholesterolemia, unspecified: Secondary | ICD-10-CM

## 2021-01-20 ENCOUNTER — Encounter: Payer: Self-pay | Admitting: Primary Care

## 2021-01-20 ENCOUNTER — Ambulatory Visit (INDEPENDENT_AMBULATORY_CARE_PROVIDER_SITE_OTHER): Payer: Medicare Other | Admitting: Primary Care

## 2021-01-20 ENCOUNTER — Other Ambulatory Visit: Payer: Self-pay

## 2021-01-20 VITALS — BP 120/62 | HR 48 | Temp 99.0°F | Ht 60.0 in | Wt 104.0 lb

## 2021-01-20 DIAGNOSIS — E7800 Pure hypercholesterolemia, unspecified: Secondary | ICD-10-CM

## 2021-01-20 DIAGNOSIS — E2839 Other primary ovarian failure: Secondary | ICD-10-CM

## 2021-01-20 DIAGNOSIS — R920 Mammographic microcalcification found on diagnostic imaging of breast: Secondary | ICD-10-CM

## 2021-01-20 DIAGNOSIS — F32A Depression, unspecified: Secondary | ICD-10-CM | POA: Diagnosis not present

## 2021-01-20 DIAGNOSIS — F419 Anxiety disorder, unspecified: Secondary | ICD-10-CM

## 2021-01-20 DIAGNOSIS — M810 Age-related osteoporosis without current pathological fracture: Secondary | ICD-10-CM

## 2021-01-20 LAB — COMPREHENSIVE METABOLIC PANEL
ALT: 21 U/L (ref 0–35)
AST: 25 U/L (ref 0–37)
Albumin: 4.9 g/dL (ref 3.5–5.2)
Alkaline Phosphatase: 88 U/L (ref 39–117)
BUN: 16 mg/dL (ref 6–23)
CO2: 28 mEq/L (ref 19–32)
Calcium: 10.1 mg/dL (ref 8.4–10.5)
Chloride: 101 mEq/L (ref 96–112)
Creatinine, Ser: 0.81 mg/dL (ref 0.40–1.20)
GFR: 74.97 mL/min (ref >60.00)
Glucose, Bld: 95 mg/dL (ref 70–99)
Potassium: 3.9 mEq/L (ref 3.5–5.1)
Sodium: 138 mEq/L (ref 135–145)
Total Bilirubin: 0.6 mg/dL (ref 0.2–1.2)
Total Protein: 7.8 g/dL (ref 6.0–8.3)

## 2021-01-20 LAB — CBC
HCT: 41.4 % (ref 36.0–46.0)
Hemoglobin: 13.6 g/dL (ref 12.0–15.0)
MCHC: 32.9 g/dL (ref 30.0–36.0)
MCV: 92.5 fl (ref 78.0–100.0)
Platelets: 244 10*3/uL (ref 150.0–400.0)
RBC: 4.47 Mil/uL (ref 3.87–5.11)
RDW: 12.5 % (ref 11.5–15.5)
WBC: 5.2 10*3/uL (ref 4.0–10.5)

## 2021-01-20 LAB — LIPID PANEL
Cholesterol: 206 mg/dL — ABNORMAL HIGH (ref 0–200)
HDL: 74 mg/dL (ref >39.00)
LDL Cholesterol: 114 mg/dL — ABNORMAL HIGH (ref 0–99)
NonHDL: 132.23
Total CHOL/HDL Ratio: 3
Triglycerides: 89 mg/dL (ref 0.0–149.0)
VLDL: 17.8 mg/dL (ref 0.0–40.0)

## 2021-01-20 NOTE — Assessment & Plan Note (Signed)
Improved since last visit, okay to reduce down to 12.5 mg daily due to drowsiness effects.  Resume 12.5 mg daily of sertraline.

## 2021-01-20 NOTE — Progress Notes (Signed)
Subjective:    Patient ID: Samantha Diaz, female    DOB: [birth year], 66 y.o.   MRN: [id number]  HPI  Samantha Diaz is a very pleasant 66 y.o. female with a history of osteoporosis, hyperlipidemia, anxiety and depression who presents today for follow up of chronic conditions and MWV Part 2.  Managed on sertraline 25 mg daily for anxiety and depression. Overall doing very well on sertraline 25 mg but this causes drowsiness. She was historically taking 12.5 mg daily but had increased symptoms this Fall 2022. She is ready to reduce to 12.5 mg now.   Managed on simvastatin 40 mg daily for hyperlipidemia. She is due for repeat lipid panel today.  Osteoporosis as of 2021, managed on calcium and vitamin D, has declined bisphosphonate and any other Rx treatment.   Immunizations: -Tetanus: 2019 -Influenza: Completed this season  -Covid-19: 2 vaccines -Shingles: Zostavax in 2017 -Pneumonia: Prevnar 13 in 2020, Pneumovax in 2021  Mammogram: Completed in December 2022 Dexa: Completed in February 2021 - osteoporosis, T score of -2.7 Colonoscopy: Completed Cologuard in 2021 - negative, due 2024  BP Readings from Last 3 Encounters:  01/20/21 120/62  10/26/20 120/62  01/01/20 115/64       Review of Systems  Constitutional:  Negative for unexpected weight change.  HENT:  Negative for rhinorrhea.   Respiratory:  Negative for shortness of breath.   Cardiovascular:  Negative for chest pain.  Gastrointestinal:  Negative for constipation and diarrhea.  Genitourinary:  Negative for difficulty urinating.  Musculoskeletal:  Negative for arthralgias and myalgias.  Skin:  Negative for rash.  Allergic/Immunologic: Negative for environmental allergies.  Neurological:  Negative for dizziness and headaches.  Psychiatric/Behavioral:  The patient is not nervous/anxious.         Past Medical History:  Diagnosis Date   History of UTI    Hyperlipidemia    Murmur     Social History    Socioeconomic History   Marital status: Married    Spouse name: Not on file   Number of children: Not on file   Years of education: Not on file   Highest education level: Not on file  Occupational History   Not on file  Tobacco Use   Smoking status: Former    Types: Cigarettes    Quit date: 01/16/2004    Years since quitting: 17.0   Smokeless tobacco: Never  Substance and Sexual Activity   Alcohol use: Yes    Alcohol/week: 0.0 standard drinks    Comment: rarely   Drug use: No   Sexual activity: Not on file  Other Topics Concern   Not on file  Social History Narrative   Married.   No children.   Enjoys reading, traveling to the mountains.   Works in Therapist, art.   Social Determinants of Health   Financial Resource Strain: Low Risk    Difficulty of Paying Living Expenses: Not hard at all  Food Insecurity: No Food Insecurity   Worried About Charity fundraiser in the Last Year: Never true   [place] in the Last Year: Never true  Transportation Needs: No Transportation Needs   Lack of Transportation (Medical): No   Lack of Transportation (Non-Medical): No  Physical Activity: Inactive   Days of Exercise per Week: 0 days   Minutes of Exercise per Session: 0 min  Stress: No Stress Concern Present   Feeling of Stress : Only a little  Social Connections: Moderately Isolated  Frequency of Communication with Friends and Family: Once a week   Frequency of Social Gatherings with Friends and Family: More than three times a week   Attends Religious Services: Never   Marine scientist or Organizations: No   Attends Music therapist: Never   Marital Status: Married  Human resources officer Violence: Not At Risk   Fear of Current or Ex-Partner: No   Emotionally Abused: No   Physically Abused: No   Sexually Abused: No    Past Surgical History:  Procedure Laterality Date   BREAST BIOPSY Left 2014   neg-Fibrocystic changes   CHOLECYSTECTOMY  2013     Family History  Problem Relation Age of Onset   Cancer Father        skin   Hyperlipidemia Father    Hypertension Mother    Cancer Maternal Grandfather        lung   Breast cancer Neg Hx     Allergies  Allergen Reactions   Sulfa Antibiotics Swelling    Tongue swelling   Latex Rash    Current Outpatient Medications on File Prior to Visit  Medication Sig Dispense Refill   Calcium-Magnesium-Vitamin D (CALCIUM MAGNESIUM PO) Take by mouth.     Coenzyme Q10 (COQ10) 100 MG CAPS Take 1 capsule by mouth daily.     Echinacea 400 MG CAPS Take 1 capsule by mouth daily.     sertraline (ZOLOFT) 25 MG tablet Take 25 mg by mouth daily.     simvastatin (ZOCOR) 40 MG tablet Take 1 tablet (40 mg total) by mouth daily. For cholesterol. Office visit required for further refills. 90 tablet 0   VITAMIN E PO      No current facility-administered medications on file prior to visit.    BP 120/62    Pulse (!) 48    Temp 99 F (37.2 C) (Temporal)    Ht 5' (1.524 m)    Wt 104 lb (47.2 kg)    SpO2 98%    BMI 20.31 kg/m  Objective:   Physical Exam HENT:     Right Ear: Tympanic membrane and ear canal normal.     Left Ear: Tympanic membrane and ear canal normal.     Nose: Nose normal.  Eyes:     Conjunctiva/sclera: Conjunctivae normal.     Pupils: Pupils are equal, round, and reactive to light.  Neck:     Thyroid: No thyromegaly.  Cardiovascular:     Rate and Rhythm: Normal rate and regular rhythm.     Heart sounds: No murmur heard. Pulmonary:     Effort: Pulmonary effort is normal.     Breath sounds: Normal breath sounds. No rales.  Abdominal:     General: Bowel sounds are normal.     Palpations: Abdomen is soft.     Tenderness: There is no abdominal tenderness.  Musculoskeletal:        General: Normal range of motion.     Cervical back: Neck supple.  Lymphadenopathy:     Cervical: No cervical adenopathy.  Skin:    General: Skin is warm and dry.     Findings: No rash.   Neurological:     Mental Status: She is alert and oriented to person, place, and time.     Cranial Nerves: No cranial nerve deficit.     Deep Tendon Reflexes: Reflexes are normal and symmetric.  Psychiatric:        Mood and Affect: Mood normal.  Assessment & Plan:      This visit occurred during the SARS-CoV-2 public health emergency.  Safety protocols were in place, including screening questions prior to the visit, additional usage of staff PPE, and extensive cleaning of exam room while observing appropriate contact time as indicated for disinfecting solutions.

## 2021-01-20 NOTE — Patient Instructions (Signed)
Stop by the lab prior to leaving today. I will notify you of your results once received.   [facility] density test.   Start exercising. You should be getting 150 minutes of exercise weekly.  It was a pleasure to see you today!

## 2021-01-20 NOTE — Assessment & Plan Note (Signed)
Compliant to calcium and vitamin D. Encouraged weight bearing exercise. Declines Rx treatment.  Repeat bone density scan pending.

## 2021-01-20 NOTE — Assessment & Plan Note (Signed)
Determined benign. Reviewed mammogram from December 2022.  Repeat in December 2023

## 2021-01-20 NOTE — Telephone Encounter (Signed)
See in office today

## 2021-01-20 NOTE — Assessment & Plan Note (Signed)
Compliant to simvastatin 40 mg, continue same. Repeat lipid panel pending.

## 2021-02-01 DIAGNOSIS — M9901 Segmental and somatic dysfunction of cervical region: Secondary | ICD-10-CM | POA: Diagnosis not present

## 2021-02-01 DIAGNOSIS — M9904 Segmental and somatic dysfunction of sacral region: Secondary | ICD-10-CM | POA: Diagnosis not present

## 2021-02-01 DIAGNOSIS — M9903 Segmental and somatic dysfunction of lumbar region: Secondary | ICD-10-CM | POA: Diagnosis not present

## 2021-02-01 DIAGNOSIS — M62830 Muscle spasm of back: Secondary | ICD-10-CM | POA: Diagnosis not present

## 2021-03-01 DIAGNOSIS — M9901 Segmental and somatic dysfunction of cervical region: Secondary | ICD-10-CM | POA: Diagnosis not present

## 2021-03-01 DIAGNOSIS — M9904 Segmental and somatic dysfunction of sacral region: Secondary | ICD-10-CM | POA: Diagnosis not present

## 2021-03-01 DIAGNOSIS — M62830 Muscle spasm of back: Secondary | ICD-10-CM | POA: Diagnosis not present

## 2021-03-01 DIAGNOSIS — M9903 Segmental and somatic dysfunction of lumbar region: Secondary | ICD-10-CM | POA: Diagnosis not present

## 2021-03-29 DIAGNOSIS — M9904 Segmental and somatic dysfunction of sacral region: Secondary | ICD-10-CM | POA: Diagnosis not present

## 2021-03-29 DIAGNOSIS — M9903 Segmental and somatic dysfunction of lumbar region: Secondary | ICD-10-CM | POA: Diagnosis not present

## 2021-03-29 DIAGNOSIS — M62830 Muscle spasm of back: Secondary | ICD-10-CM | POA: Diagnosis not present

## 2021-03-29 DIAGNOSIS — M9901 Segmental and somatic dysfunction of cervical region: Secondary | ICD-10-CM | POA: Diagnosis not present

## 2021-04-26 DIAGNOSIS — M9903 Segmental and somatic dysfunction of lumbar region: Secondary | ICD-10-CM | POA: Diagnosis not present

## 2021-04-26 DIAGNOSIS — M9901 Segmental and somatic dysfunction of cervical region: Secondary | ICD-10-CM | POA: Diagnosis not present

## 2021-04-26 DIAGNOSIS — M9904 Segmental and somatic dysfunction of sacral region: Secondary | ICD-10-CM | POA: Diagnosis not present

## 2021-04-26 DIAGNOSIS — M62830 Muscle spasm of back: Secondary | ICD-10-CM | POA: Diagnosis not present

## 2021-05-24 DIAGNOSIS — M9903 Segmental and somatic dysfunction of lumbar region: Secondary | ICD-10-CM | POA: Diagnosis not present

## 2021-05-24 DIAGNOSIS — M9901 Segmental and somatic dysfunction of cervical region: Secondary | ICD-10-CM | POA: Diagnosis not present

## 2021-05-24 DIAGNOSIS — M9904 Segmental and somatic dysfunction of sacral region: Secondary | ICD-10-CM | POA: Diagnosis not present

## 2021-05-24 DIAGNOSIS — M62830 Muscle spasm of back: Secondary | ICD-10-CM | POA: Diagnosis not present

## 2021-06-28 DIAGNOSIS — M9904 Segmental and somatic dysfunction of sacral region: Secondary | ICD-10-CM | POA: Diagnosis not present

## 2021-06-28 DIAGNOSIS — M62830 Muscle spasm of back: Secondary | ICD-10-CM | POA: Diagnosis not present

## 2021-06-28 DIAGNOSIS — M9901 Segmental and somatic dysfunction of cervical region: Secondary | ICD-10-CM | POA: Diagnosis not present

## 2021-06-28 DIAGNOSIS — M9903 Segmental and somatic dysfunction of lumbar region: Secondary | ICD-10-CM | POA: Diagnosis not present

## 2021-07-26 DIAGNOSIS — M62830 Muscle spasm of back: Secondary | ICD-10-CM | POA: Diagnosis not present

## 2021-07-26 DIAGNOSIS — M9904 Segmental and somatic dysfunction of sacral region: Secondary | ICD-10-CM | POA: Diagnosis not present

## 2021-07-26 DIAGNOSIS — M9903 Segmental and somatic dysfunction of lumbar region: Secondary | ICD-10-CM | POA: Diagnosis not present

## 2021-07-26 DIAGNOSIS — M9901 Segmental and somatic dysfunction of cervical region: Secondary | ICD-10-CM | POA: Diagnosis not present

## 2021-08-23 DIAGNOSIS — M62830 Muscle spasm of back: Secondary | ICD-10-CM | POA: Diagnosis not present

## 2021-08-23 DIAGNOSIS — M9901 Segmental and somatic dysfunction of cervical region: Secondary | ICD-10-CM | POA: Diagnosis not present

## 2021-08-23 DIAGNOSIS — M9903 Segmental and somatic dysfunction of lumbar region: Secondary | ICD-10-CM | POA: Diagnosis not present

## 2021-08-23 DIAGNOSIS — M9904 Segmental and somatic dysfunction of sacral region: Secondary | ICD-10-CM | POA: Diagnosis not present

## 2021-09-12 DIAGNOSIS — H2513 Age-related nuclear cataract, bilateral: Secondary | ICD-10-CM | POA: Diagnosis not present

## 2021-09-12 DIAGNOSIS — H52223 Regular astigmatism, bilateral: Secondary | ICD-10-CM | POA: Diagnosis not present

## 2021-09-12 DIAGNOSIS — H5203 Hypermetropia, bilateral: Secondary | ICD-10-CM | POA: Diagnosis not present

## 2021-09-12 DIAGNOSIS — H35341 Macular cyst, hole, or pseudohole, right eye: Secondary | ICD-10-CM | POA: Diagnosis not present

## 2021-09-12 DIAGNOSIS — H524 Presbyopia: Secondary | ICD-10-CM | POA: Diagnosis not present

## 2021-09-20 DIAGNOSIS — M9903 Segmental and somatic dysfunction of lumbar region: Secondary | ICD-10-CM | POA: Diagnosis not present

## 2021-09-20 DIAGNOSIS — M62830 Muscle spasm of back: Secondary | ICD-10-CM | POA: Diagnosis not present

## 2021-09-20 DIAGNOSIS — M9904 Segmental and somatic dysfunction of sacral region: Secondary | ICD-10-CM | POA: Diagnosis not present

## 2021-09-20 DIAGNOSIS — M9901 Segmental and somatic dysfunction of cervical region: Secondary | ICD-10-CM | POA: Diagnosis not present

## 2021-09-20 DIAGNOSIS — H43822 Vitreomacular adhesion, left eye: Secondary | ICD-10-CM | POA: Diagnosis not present

## 2021-09-20 DIAGNOSIS — H2513 Age-related nuclear cataract, bilateral: Secondary | ICD-10-CM | POA: Diagnosis not present

## 2021-09-20 DIAGNOSIS — H35341 Macular cyst, hole, or pseudohole, right eye: Secondary | ICD-10-CM | POA: Diagnosis not present

## 2021-10-02 DIAGNOSIS — H43822 Vitreomacular adhesion, left eye: Secondary | ICD-10-CM | POA: Diagnosis not present

## 2021-10-02 DIAGNOSIS — H25813 Combined forms of age-related cataract, bilateral: Secondary | ICD-10-CM | POA: Diagnosis not present

## 2021-10-02 DIAGNOSIS — H35341 Macular cyst, hole, or pseudohole, right eye: Secondary | ICD-10-CM | POA: Diagnosis not present

## 2021-10-05 DIAGNOSIS — H2511 Age-related nuclear cataract, right eye: Secondary | ICD-10-CM | POA: Diagnosis not present

## 2021-10-05 DIAGNOSIS — H35341 Macular cyst, hole, or pseudohole, right eye: Secondary | ICD-10-CM | POA: Diagnosis not present

## 2021-10-13 DIAGNOSIS — H35341 Macular cyst, hole, or pseudohole, right eye: Secondary | ICD-10-CM | POA: Diagnosis not present

## 2021-10-13 DIAGNOSIS — H43822 Vitreomacular adhesion, left eye: Secondary | ICD-10-CM | POA: Diagnosis not present

## 2021-10-25 DIAGNOSIS — M62830 Muscle spasm of back: Secondary | ICD-10-CM | POA: Diagnosis not present

## 2021-10-25 DIAGNOSIS — M9904 Segmental and somatic dysfunction of sacral region: Secondary | ICD-10-CM | POA: Diagnosis not present

## 2021-10-25 DIAGNOSIS — M9901 Segmental and somatic dysfunction of cervical region: Secondary | ICD-10-CM | POA: Diagnosis not present

## 2021-10-25 DIAGNOSIS — M9903 Segmental and somatic dysfunction of lumbar region: Secondary | ICD-10-CM | POA: Diagnosis not present

## 2021-11-10 DIAGNOSIS — H35341 Macular cyst, hole, or pseudohole, right eye: Secondary | ICD-10-CM | POA: Diagnosis not present

## 2021-11-10 DIAGNOSIS — H43822 Vitreomacular adhesion, left eye: Secondary | ICD-10-CM | POA: Diagnosis not present

## 2021-11-22 DIAGNOSIS — M9901 Segmental and somatic dysfunction of cervical region: Secondary | ICD-10-CM | POA: Diagnosis not present

## 2021-11-22 DIAGNOSIS — M9903 Segmental and somatic dysfunction of lumbar region: Secondary | ICD-10-CM | POA: Diagnosis not present

## 2021-11-22 DIAGNOSIS — M62830 Muscle spasm of back: Secondary | ICD-10-CM | POA: Diagnosis not present

## 2021-11-22 DIAGNOSIS — M9904 Segmental and somatic dysfunction of sacral region: Secondary | ICD-10-CM | POA: Diagnosis not present

## 2021-11-29 ENCOUNTER — Other Ambulatory Visit: Payer: Self-pay | Admitting: Primary Care

## 2021-11-29 DIAGNOSIS — Z1231 Encounter for screening mammogram for malignant neoplasm of breast: Secondary | ICD-10-CM

## 2021-12-14 DIAGNOSIS — H524 Presbyopia: Secondary | ICD-10-CM | POA: Diagnosis not present

## 2021-12-14 DIAGNOSIS — H5203 Hypermetropia, bilateral: Secondary | ICD-10-CM | POA: Diagnosis not present

## 2021-12-14 DIAGNOSIS — H35341 Macular cyst, hole, or pseudohole, right eye: Secondary | ICD-10-CM | POA: Diagnosis not present

## 2021-12-14 DIAGNOSIS — H2512 Age-related nuclear cataract, left eye: Secondary | ICD-10-CM | POA: Diagnosis not present

## 2021-12-14 DIAGNOSIS — H52223 Regular astigmatism, bilateral: Secondary | ICD-10-CM | POA: Diagnosis not present

## 2021-12-20 ENCOUNTER — Ambulatory Visit
Admission: RE | Admit: 2021-12-20 | Discharge: 2021-12-20 | Disposition: A | Discharge status: Home / Self Care | Payer: Medicare Other | Source: Ambulatory Visit | Attending: Primary Care | Admitting: Primary Care

## 2021-12-20 DIAGNOSIS — M9903 Segmental and somatic dysfunction of lumbar region: Secondary | ICD-10-CM | POA: Diagnosis not present

## 2021-12-20 DIAGNOSIS — Z1231 Encounter for screening mammogram for malignant neoplasm of breast: Secondary | ICD-10-CM | POA: Diagnosis not present

## 2021-12-20 DIAGNOSIS — M9901 Segmental and somatic dysfunction of cervical region: Secondary | ICD-10-CM | POA: Diagnosis not present

## 2021-12-20 DIAGNOSIS — M9904 Segmental and somatic dysfunction of sacral region: Secondary | ICD-10-CM | POA: Diagnosis not present

## 2021-12-20 DIAGNOSIS — Z23 Encounter for immunization: Secondary | ICD-10-CM | POA: Diagnosis not present

## 2021-12-20 DIAGNOSIS — M62830 Muscle spasm of back: Secondary | ICD-10-CM | POA: Diagnosis not present

## 2021-12-21 ENCOUNTER — Telehealth: Payer: Self-pay | Admitting: Primary Care

## 2021-12-21 NOTE — Telephone Encounter (Signed)
LVM for pt to rtn my cal to schedule AWV with [facility] call back # [phone]

## 2022-01-09 ENCOUNTER — Ambulatory Visit (INDEPENDENT_AMBULATORY_CARE_PROVIDER_SITE_OTHER): Payer: Medicare Other

## 2022-01-09 VITALS — Ht 60.0 in | Wt 104.0 lb

## 2022-01-09 DIAGNOSIS — Z0000 Encounter for general adult medical examination without abnormal findings: Secondary | ICD-10-CM

## 2022-01-09 NOTE — Patient Instructions (Addendum)
[name] , Thank you for taking time to come for your Medicare Wellness Visit. I appreciate your ongoing commitment to your health goals. Please review the following plan we discussed and let me know if I can assist you in the future.   These are the goals we discussed:  Goals       Patient Stated     DIET - REDUCE SUGAR INTAKE (pt-stated)      Snack healthier      Other     Increase physical activity      Patient Stated      Would like to maintain current routine.         This is a list of the screening recommended for you and due dates:  Health Maintenance  Topic Date Due   COVID-19 Vaccine (3 - 2023-24 season) 01/25/2022*   Zoster (Shingles) Vaccine (1 of 2) 04/10/2022*   Cologuard (Stool DNA test)  05/08/2022   Medicare Annual Wellness Visit  01/10/2023   Mammogram  12/21/2023   DTaP/Tdap/Td vaccine (2 - Td or Tdap) 01/19/2027   Pneumonia Vaccine  Completed   Flu Shot  Completed   DEXA scan (bone density measurement)  Completed   Hepatitis C Screening: USPSTF Recommendation to screen - Ages 39-79 yo.  Completed   HPV Vaccine  Aged Out  *Topic was postponed. The date shown is not the original due date.   Advanced directives: End of life planning; Advance aging; Advanced directives discussed.  Copy of current HCPOA/Living Will requested.    Conditions/risks identified: none new.   Next appointment: Follow up in one year for your annual wellness visit    Preventive Care 65 Years and Older, Female Preventive care refers to lifestyle choices and visits with your health care provider that can promote health and wellness. What does preventive care include? A yearly physical exam. This is also called an annual well check. Dental exams once or twice a year. Routine eye exams. Ask your health care provider how often you should have your eyes checked. Personal lifestyle choices, including: Daily care of your teeth and gums. Regular physical activity. Eating a healthy  diet. Avoiding tobacco and drug use. Limiting alcohol use. Practicing safe sex. Taking low-dose aspirin every day. Taking vitamin and mineral supplements as recommended by your health care provider. What happens during an annual well check? The services and screenings done by your health care provider during your annual well check will depend on your age, overall health, lifestyle risk factors, and family history of disease. Counseling  Your health care provider may ask you questions about your: Alcohol use. Tobacco use. Drug use. Emotional well-being. Home and relationship well-being. Sexual activity. Eating habits. History of falls. Memory and ability to understand (cognition). Work and work Statistician. Reproductive health. Screening  You may have the following tests or measurements: Height, weight, and BMI. Blood pressure. Lipid and cholesterol levels. These may be checked every 5 years, or more frequently if you are over 7 years old. Skin check. Lung cancer screening. You may have this screening every year starting at age 50 if you have a 30-pack-year history of smoking and currently smoke or have quit within the past 15 years. Fecal occult blood test (FOBT) of the stool. You may have this test every year starting at age 37. Flexible sigmoidoscopy or colonoscopy. You may have a sigmoidoscopy every 5 years or a colonoscopy every 10 years starting at age 42. Hepatitis C blood test. Hepatitis B blood test.  Sexually transmitted disease (STD) testing. Diabetes screening. This is done by checking your blood sugar (glucose) after you have not eaten for a while (fasting). You may have this done every 1-3 years. Bone density scan. This is done to screen for osteoporosis. You may have this done starting at age 70. Mammogram. This may be done every 1-2 years. Talk to your health care provider about how often you should have regular mammograms. Talk with your health care provider about  your test results, treatment options, and if necessary, the need for more tests. Vaccines  Your health care provider may recommend certain vaccines, such as: Influenza vaccine. This is recommended every year. Tetanus, diphtheria, and acellular pertussis (Tdap, Td) vaccine. You may need a Td booster every 10 years. Zoster vaccine. You may need this after age 40. Pneumococcal 13-valent conjugate (PCV13) vaccine. One dose is recommended after age 62. Pneumococcal polysaccharide (PPSV23) vaccine. One dose is recommended after age 1. Talk to your health care provider about which screenings and vaccines you need and how often you need them. This information is not intended to replace advice given to you by your health care provider. Make sure you discuss any questions you have with your health care provider. Document Released: 01/28/2015 Document Revised: 09/21/2015 Document Reviewed: 11/02/2014 Elsevier Interactive Patient Education  2017 [place] Prevention in the Home Falls can cause injuries. They can happen to people of all ages. There are many things you can do to make your home safe and to help prevent falls. What can I do on the outside of my home? Regularly fix the edges of walkways and driveways and fix any cracks. Remove anything that might make you trip as you walk through a door, such as a raised step or threshold. Trim any bushes or trees on the path to your home. Use bright outdoor lighting. Clear any walking paths of anything that might make someone trip, such as rocks or tools. Regularly check to see if handrails are loose or broken. Make sure that both sides of any steps have handrails. Any raised decks and porches should have guardrails on the edges. Have any leaves, snow, or ice cleared regularly. Use sand or salt on walking paths during winter. Clean up any spills in your garage right away. This includes oil or grease spills. What can I do in the bathroom? Use  night lights. Install grab bars by the toilet and in the tub and shower. Do not use towel bars as grab bars. Use non-skid mats or decals in the tub or shower. If you need to sit down in the shower, use a plastic, non-slip stool. Keep the floor dry. Clean up any water that spills on the floor as soon as it happens. Remove soap buildup in the tub or shower regularly. Attach bath mats securely with double-sided non-slip rug tape. Do not have throw rugs and other things on the floor that can make you trip. What can I do in the bedroom? Use night lights. Make sure that you have a light by your bed that is easy to reach. Do not use any sheets or blankets that are too big for your bed. They should not hang down onto the floor. Have a firm chair that has side arms. You can use this for support while you get dressed. Do not have throw rugs and other things on the floor that can make you trip. What can I do in the kitchen? Clean up any spills right away.  Avoid walking on wet floors. Keep items that you use a lot in easy-to-reach places. If you need to reach something above you, use a strong step stool that has a grab bar. Keep electrical cords out of the way. Do not use floor polish or wax that makes floors slippery. If you must use wax, use non-skid floor wax. Do not have throw rugs and other things on the floor that can make you trip. What can I do with my stairs? Do not leave any items on the stairs. Make sure that there are handrails on both sides of the stairs and use them. Fix handrails that are broken or loose. Make sure that handrails are as long as the stairways. Check any carpeting to make sure that it is firmly attached to the stairs. Fix any carpet that is loose or worn. Avoid having throw rugs at the top or bottom of the stairs. If you do have throw rugs, attach them to the floor with carpet tape. Make sure that you have a light switch at the top of the stairs and the bottom of the  stairs. If you do not have them, ask someone to add them for you. What else can I do to help prevent falls? Wear shoes that: Do not have high heels. Have rubber bottoms. Are comfortable and fit you well. Are closed at the toe. Do not wear sandals. If you use a stepladder: Make sure that it is fully opened. Do not climb a closed stepladder. Make sure that both sides of the stepladder are locked into place. Ask someone to hold it for you, if possible. Clearly mark and make sure that you can see: Any grab bars or handrails. First and last steps. Where the edge of each step is. Use tools that help you move around (mobility aids) if they are needed. These include: Canes. Walkers. Scooters. Crutches. Turn on the lights when you go into a dark area. Replace any light bulbs as soon as they burn out. Set up your furniture so you have a clear path. Avoid moving your furniture around. If any of your floors are uneven, fix them. If there are any pets around you, be aware of where they are. Review your medicines with your doctor. Some medicines can make you feel dizzy. This can increase your chance of falling. Ask your doctor what other things that you can do to help prevent falls. This information is not intended to replace advice given to you by your health care provider. Make sure you discuss any questions you have with your health care provider. Document Released: 10/28/2008 Document Revised: 06/09/2015 Document Reviewed: 02/05/2014 Elsevier Interactive Patient Education  2017 [employer].

## 2022-01-09 NOTE — Progress Notes (Signed)
Subjective:   Samantha Diaz is a 66 y.o. female who presents for Medicare Annual (Subsequent) preventive examination.  Review of Systems    No ROS.  Medicare Wellness Virtual Visit.  Visual/audio telehealth visit, UTA vital signs.   See social history for additional risk factors.   Cardiac Risk Factors include: advanced age (>38mn, >>70women)     Objective:    Today's Vitals   01/09/22 0903  Weight: 104 lb (47.2 kg)  Height: 5' (1.524 m)   Body mass index is 20.31 kg/m.     01/09/2022    9:17 AM 01/05/2021    2:57 PM  Advanced Directives  Does Patient Have a Medical Advance Directive? Yes Yes  Type of [place]amed[place]of [place] will [place] will  Does patient want to make changes to medical advance directive? No - Patient declined Yes (MAU/Ambulatory/Procedural Areas - Information given)  Copy of [place]? No - copy requested     Current Medications (verified) Outpatient Encounter Medications as of 01/09/2022  Medication Sig   Calcium-Magnesium-Vitamin D (CALCIUM MAGNESIUM PO) Take by mouth.   Coenzyme Q10 (COQ10) 100 MG CAPS Take 1 capsule by mouth daily.   Echinacea 400 MG CAPS Take 1 capsule by mouth daily.   sertraline (ZOLOFT) 25 MG tablet Take 0.5 tablets (12.5 mg total) by mouth daily. For anxiety and depression   simvastatin (ZOCOR) 40 MG tablet TAKE 1 TABLET BY MOUTH EVERY DAY FOR CHOLESTEROL   VITAMIN E PO    No facility-administered encounter medications on file as of 01/09/2022.    Allergies (verified) Sulfa antibiotics and Latex   History: Past Medical History:  Diagnosis Date   History of UTI    Hyperlipidemia    Murmur    Past Surgical History:  Procedure Laterality Date   BREAST BIOPSY Left 2014   neg-Fibrocystic changes   CHOLECYSTECTOMY  2013   Family History  Problem Relation Age of Onset   Cancer Father        skin   Hyperlipidemia Father     Hypertension Mother    Cancer Maternal Grandfather        lung   Breast cancer Neg Hx    Social History   Socioeconomic History   Marital status: Married    Spouse name: Not on file   Number of children: Not on file   Years of education: Not on file   Highest education level: Not on file  Occupational History   Not on file  Tobacco Use   Smoking status: Former    Types: Cigarettes    Quit date: 01/16/2004    Years since quitting: 17.9   Smokeless tobacco: Never  Substance and Sexual Activity   Alcohol use: Yes    Alcohol/week: 0.0 standard drinks of alcohol    Comment: rarely   Drug use: No   Sexual activity: Not on file  Other Topics Concern   Not on file  Social History Narrative   Married.   No children.   Enjoys reading, traveling to the mountains.   Works in CTherapist, art   Social Determinants of Health   Financial Resource Strain: Low Risk  (01/09/2022)   Overall Financial Resource Strain (CARDIA)    Difficulty of Paying Living Expenses: Not hard at all  Food Insecurity: No Food Insecurity (01/09/2022)   Hunger Vital Sign    Worried About Running Out of Food in the Last Year: Never  true    Ran Out of Food in the Last Year: Never true  Transportation Needs: No Transportation Needs (01/09/2022)   PRAPARE - Hydrologist (Medical): No    Lack of Transportation (Non-Medical): No  Physical Activity: Inactive (01/05/2021)   Exercise Vital Sign    Days of Exercise per Week: 0 days    Minutes of Exercise per Session: 0 min  Stress: Stress Concern Present (01/09/2022)   Wells    Feeling of Stress : To some extent  Social Connections: Moderately Isolated (01/09/2022)   Social Connection and Isolation Panel [NHANES]    Frequency of Communication with Friends and Family: Once a week    Frequency of Social Gatherings with Friends and Family: More than three times a  week    Attends Religious Services: Never    Marine scientist or Organizations: No    Attends Music therapist: Never    Marital Status: Married    Tobacco Counseling Counseling given: Not Answered   Clinical Intake:  Pre-visit preparation completed: Yes        Diabetes: No  How often do you need to have someone help you when you read instructions, pamphlets, or other written materials from your doctor or pharmacy?: 1 - Never    Interpreter Needed?: No      Activities of Daily Living    01/09/2022    9:12 AM  In your present state of health, do you have any difficulty performing the following activities:  Hearing? 0  Vision? 0  Difficulty concentrating or making decisions? 0  Walking or climbing stairs? 0  Dressing or bathing? 0  Doing errands, shopping? 0  Preparing Food and eating ? N  Using the Toilet? N  In the past six months, have you accidently leaked urine? N  Do you have problems with loss of bowel control? N  Managing your Medications? N  Managing your Finances? N  Housekeeping or managing your Housekeeping? N    Patient Care Team: Pleas Koch, NP as PCP - General (Nurse Practitioner) Bary Castilla [place] Gleason, MD as Consulting Physician (General Surgery)  Indicate any recent Medical Services you may have received from other than [facility] providers in the past year (date may be approximate).     Assessment:   This is a routine wellness examination for Samantha Diaz.  I connected with  [name] on 01/09/22 by a audio enabled telemedicine application and verified that I am speaking with the correct person using two identifiers.  Patient Location: Home  Provider Location: Office/Clinic  I discussed the limitations of evaluation and management by telemedicine. The patient expressed understanding and agreed to proceed.   Hearing/Vision screen Hearing Screening - Comments:: Patient is able to hear conversational tones  without difficulty.  No issues reported.   Vision Screening - Comments:: Cataract extracted, R eye Wears glasses They have seen their ophthalmologist in the last 12 months.    Dietary issues and exercise activities discussed: Current Exercise Habits: Home exercise routine, Type of exercise: walking, Intensity: Mild Regular diet; she tries to eat fairly well. Eats 2 meals daily.  Good water intake.   Goals Addressed               This Visit's Progress     Patient Stated     DIET - REDUCE SUGAR INTAKE (pt-stated)        Snack healthier  Other     Increase physical activity         Depression Screen    01/09/2022    9:16 AM 01/20/2021    8:01 AM 01/05/2021    3:03 PM 10/26/2020    8:26 AM 01/01/2020    1:59 PM 12/31/2018    8:45 AM 12/12/2016    8:41 AM  PHQ 2/9 Scores  PHQ - 2 Score 0 0 1 1 0 2 1  PHQ- 9 Score  0  4   11    Fall Risk    01/09/2022    9:17 AM 01/05/2021    3:00 PM 01/01/2020    1:59 PM 12/31/2018    8:46 AM  [place] in the past year? 0 0 0 0  Number falls in past yr: 0 0  0  Injury with Fall? 0 0  0  Risk for fall due to :  No Fall Risks    Follow up Falls evaluation completed Falls prevention discussed      FALL RISK PREVENTION PERTAINING TO THE HOME: Home free of loose throw rugs in walkways, pet beds, electrical cords, etc? Yes  Adequate lighting in your home to reduce risk of falls? Yes   ASSISTIVE DEVICES UTILIZED TO PREVENT FALLS: Life alert?  Use of a cane, walker or w/c? No   TIMED UP AND GO: Was the test performed? No .   Cognitive Function:        01/09/2022    9:20 AM  6CIT Screen  What Year? 0 points  What month? 0 points  What time? 0 points  Count back from 20 0 points  Months in reverse 0 points  Repeat phrase 0 points  Total Score 0 points    Immunizations Immunization History  Administered Date(s) Administered   Fluad Quad(high Dose 65+) 01/01/2020, 10/26/2020   Influenza Inj Mdck  Quad Pf 11/09/2017   Influenza,inj,Quad PF,6+ Mos 12/12/2015, 11/10/2016, 11/02/2018   Influenza-Unspecified 11/10/2016, 12/20/2021   Janssen (J&J) SARS-COV-2 Vaccination 04/27/2019, 12/05/2019   Pneumococcal Conjugate-13 12/31/2018   Pneumococcal Polysaccharide-23 01/01/2020   Tdap 01/18/2017   Zoster, Live 11/03/2015   Shingrix Completed?: No.    Education has been provided regarding the importance of this vaccine. Patient has been advised to call insurance company to determine out of pocket expense if they have not yet received this vaccine. Advised may also receive vaccine at local pharmacy or Health Dept. Verbalized acceptance and understanding.  Screening Tests Health Maintenance  Topic Date Due   COVID-19 Vaccine (3 - 2023-24 season) 01/25/2022 (Originally 09/15/2021)   Zoster Vaccines- Shingrix (1 of 2) 04/10/2022 (Originally 05/15/2003)   Fecal DNA (Cologuard)  05/08/2022   Medicare Annual Wellness (AWV)  01/10/2023   MAMMOGRAM  12/21/2023   DTaP/Tdap/Td (2 - Td or Tdap) 01/19/2027   Pneumonia Vaccine 39+ Years old  Completed   INFLUENZA VACCINE  Completed   DEXA SCAN  Completed   Hepatitis C Screening  Completed   HPV VACCINES  Aged Out   Health Maintenance There are no preventive care reminders to display for this patient.  Bone Density- agrees to schedule.   Lung Cancer Screening: (Low Dose CT Chest recommended if Age 46-80 years, 30 pack-year currently smoking OR have quit w/in 15years.) does not qualify.   Hepatitis C Screening: Completed 06/2018.  Vision Screening: Recommended annual ophthalmology exams for early detection of glaucoma and other disorders of the eye.  Dental Screening: Recommended annual dental exams for  proper oral hygiene.  Community Resource Referral / Chronic Care Management: CRR required this visit?  No   CCM required this visit?  No      Plan:     I have personally reviewed and noted the following in the patient's chart:   Medical  and social history Use of alcohol, tobacco or illicit drugs  Current medications and supplements including opioid prescriptions. Patient is not currently taking opioid prescriptions. Functional ability and status Nutritional status Physical activity Advanced directives List of other physicians Hospitalizations, surgeries, and ER visits in previous 12 months Vitals Screenings to include cognitive, depression, and falls Referrals and appointments  In addition, I have reviewed and discussed with patient certain preventive protocols, quality metrics, and best practice recommendations. A written personalized care plan for preventive services as well as general preventive health recommendations were provided to patient.     Leta Jungling, LPN   [id number]

## 2022-01-13 ENCOUNTER — Other Ambulatory Visit: Payer: Self-pay | Admitting: Primary Care

## 2022-01-13 DIAGNOSIS — F419 Anxiety disorder, unspecified: Secondary | ICD-10-CM

## 2022-01-15 HISTORY — PX: CATARACT EXTRACTION: SUR2

## 2022-01-17 DIAGNOSIS — M9904 Segmental and somatic dysfunction of sacral region: Secondary | ICD-10-CM | POA: Diagnosis not present

## 2022-01-17 DIAGNOSIS — M9903 Segmental and somatic dysfunction of lumbar region: Secondary | ICD-10-CM | POA: Diagnosis not present

## 2022-01-17 DIAGNOSIS — M9901 Segmental and somatic dysfunction of cervical region: Secondary | ICD-10-CM | POA: Diagnosis not present

## 2022-01-17 DIAGNOSIS — M62830 Muscle spasm of back: Secondary | ICD-10-CM | POA: Diagnosis not present

## 2022-01-24 ENCOUNTER — Encounter: Payer: Self-pay | Admitting: Primary Care

## 2022-01-24 ENCOUNTER — Ambulatory Visit (INDEPENDENT_AMBULATORY_CARE_PROVIDER_SITE_OTHER): Payer: Medicare Other | Admitting: Primary Care

## 2022-01-24 VITALS — BP 122/78 | HR 64 | Temp 98.1°F | Ht 60.0 in | Wt 105.0 lb

## 2022-01-24 DIAGNOSIS — E7800 Pure hypercholesterolemia, unspecified: Secondary | ICD-10-CM

## 2022-01-24 DIAGNOSIS — F419 Anxiety disorder, unspecified: Secondary | ICD-10-CM

## 2022-01-24 DIAGNOSIS — Z1211 Encounter for screening for malignant neoplasm of colon: Secondary | ICD-10-CM

## 2022-01-24 DIAGNOSIS — E2839 Other primary ovarian failure: Secondary | ICD-10-CM | POA: Diagnosis not present

## 2022-01-24 DIAGNOSIS — F32A Depression, unspecified: Secondary | ICD-10-CM

## 2022-01-24 DIAGNOSIS — M810 Age-related osteoporosis without current pathological fracture: Secondary | ICD-10-CM | POA: Diagnosis not present

## 2022-01-24 LAB — LIPID PANEL
Cholesterol: 227 mg/dL — ABNORMAL HIGH (ref 0–200)
HDL: 78.4 mg/dL (ref >39.00)
LDL Cholesterol: 128 mg/dL — ABNORMAL HIGH (ref 0–99)
NonHDL: 148.88
Total CHOL/HDL Ratio: 3
Triglycerides: 106 mg/dL (ref 0.0–149.0)
VLDL: 21.2 mg/dL (ref 0.0–40.0)

## 2022-01-24 LAB — COMPREHENSIVE METABOLIC PANEL
ALT: 16 U/L (ref 0–35)
AST: 25 U/L (ref 0–37)
Albumin: 4.5 g/dL (ref 3.5–5.2)
Alkaline Phosphatase: 78 U/L (ref 39–117)
BUN: 18 mg/dL (ref 6–23)
CO2: 26 mEq/L (ref 19–32)
Calcium: 9.5 mg/dL (ref 8.4–10.5)
Chloride: 104 mEq/L (ref 96–112)
Creatinine, Ser: 0.74 mg/dL (ref 0.40–1.20)
GFR: 82.97 mL/min (ref >60.00)
Glucose, Bld: 94 mg/dL (ref 70–99)
Potassium: 4.3 mEq/L (ref 3.5–5.1)
Sodium: 140 mEq/L (ref 135–145)
Total Bilirubin: 0.7 mg/dL (ref 0.2–1.2)
Total Protein: 7.5 g/dL (ref 6.0–8.3)

## 2022-01-24 NOTE — Patient Instructions (Signed)
Stop by the lab prior to leaving today. I will notify you of your results once received.   Call the [facility] to schedule your bone density scan.   Complete the Cologuard kit in April 2024  It was a pleasure to see you today!

## 2022-01-24 NOTE — Progress Notes (Signed)
Subjective:    Patient ID: Samantha Diaz, female    DOB: 10-11-1955, 67 y.o.   MRN: [id number]  HPI  Samantha Diaz is a very pleasant 67 y.o. female  has a past medical history of History of UTI, Hyperlipidemia, and Murmur. who presents today for follow up of chronic conditions.   1) Osteoporosis: Last bone density scan was 02/24/19 with T score of -2.7, osteoporosis to AP spine, osteopenia to left femur neck. She is taking calcium and vitamin D daily. She is not exercising much. She is working to clean out her mother's house.   2) Hyperlipidemia: Currently managed on simvastatin 40 mg daily. She is due for repeat lipid panel today. She has noticed a lot of muscle aches, especially in the AM when waking. She moved her simvastatin from night to during the day which has helped to reduce her muscle aches.   3) Anxiety and Depression: Currently managed on sertraline 25 mg daily, typically takes 12.5 mg daily, did have to increase to 25 mg in the Fall 2023. Overall she feels that she's doing well, is now back down to 12.5 mg daily most days.   BP Readings from Last 3 Encounters:  01/24/22 122/78  01/20/21 120/62  10/26/20 120/62     Review of Systems  HENT:  Negative for rhinorrhea.   Respiratory:  Negative for cough and shortness of breath.   Cardiovascular:  Negative for chest pain.  Gastrointestinal:  Negative for constipation and diarrhea.  Neurological:  Negative for dizziness and headaches.  Psychiatric/Behavioral:  The patient is not nervous/anxious.          Past Medical History:  Diagnosis Date   History of UTI    Hyperlipidemia    Murmur     Social History   Socioeconomic History   Marital status: Married    Spouse name: Not on file   Number of children: Not on file   Years of education: Not on file   Highest education level: Not on file  Occupational History   Not on file  Tobacco Use   Smoking status: Former    Types: Cigarettes    Quit date:  01/16/2004    Years since quitting: 18.0   Smokeless tobacco: Never  Substance and Sexual Activity   Alcohol use: Yes    Alcohol/week: 0.0 standard drinks of alcohol    Comment: rarely   Drug use: No   Sexual activity: Not on file  Other Topics Concern   Not on file  Social History Narrative   Married.   No children.   Enjoys reading, traveling to the mountains.   Works in Therapist, art.   Social Determinants of Health   Financial Resource Strain: Low Risk  (01/09/2022)   Overall Financial Resource Strain (CARDIA)    Difficulty of Paying Living Expenses: Not hard at all  Food Insecurity: No Food Insecurity (01/09/2022)   Hunger Vital Sign    Worried About Running Out of Food in the Last Year: Never true    Ran Out of Food in the Last Year: Never true  Transportation Needs: No Transportation Needs (01/09/2022)   PRAPARE - Hydrologist (Medical): No    Lack of Transportation (Non-Medical): No  Physical Activity: Inactive (01/05/2021)   Exercise Vital Sign    Days of Exercise per Week: 0 days    Minutes of Exercise per Session: 0 min  Stress: Stress Concern Present (01/09/2022)   [employer]  of Occupational Health - Occupational Stress Questionnaire    Feeling of Stress : To some extent  Social Connections: Moderately Isolated (01/09/2022)   Social Connection and Isolation Panel [NHANES]    Frequency of Communication with Friends and Family: Once a week    Frequency of Social Gatherings with Friends and Family: More than three times a week    Attends Religious Services: Never    Marine scientist or Organizations: No    Attends Archivist Meetings: Never    Marital Status: Married  Human resources officer Violence: Not At Risk (01/09/2022)   Humiliation, Afraid, Rape, and Kick questionnaire    Fear of Current or Ex-Partner: No    Emotionally Abused: No    Physically Abused: No    Sexually Abused: No    Past Surgical History:   Procedure Laterality Date   BREAST BIOPSY Left 2014   neg-Fibrocystic changes   CHOLECYSTECTOMY  2013    Family History  Problem Relation Age of Onset   Cancer Father        skin   Hyperlipidemia Father    Hypertension Mother    Cancer Maternal Grandfather        lung   Breast cancer Neg Hx     Allergies  Allergen Reactions   Sulfa Antibiotics Swelling    Tongue swelling   Latex Rash    Current Outpatient Medications on File Prior to Visit  Medication Sig Dispense Refill   Calcium-Magnesium-Vitamin D (CALCIUM MAGNESIUM PO) Take by mouth.     Coenzyme Q10 (COQ10) 100 MG CAPS Take 1 capsule by mouth daily.     Echinacea 400 MG CAPS Take 1 capsule by mouth daily.     sertraline (ZOLOFT) 25 MG tablet TAKE 1/2 TABLET(12.5 MG) BY MOUTH DAILY FOR ANXIETY OR DEPRESSION 45 tablet 0   simvastatin (ZOCOR) 40 MG tablet TAKE 1 TABLET BY MOUTH EVERY DAY FOR CHOLESTEROL 90 tablet 3   VITAMIN E PO      No current facility-administered medications on file prior to visit.    BP 122/78   Pulse 64   Temp 98.1 F (36.7 C) (Temporal)   Ht 5' (1.524 m)   Wt 105 lb (47.6 kg)   SpO2 98%   BMI 20.51 kg/m  Objective:   Physical Exam Cardiovascular:     Rate and Rhythm: Normal rate and regular rhythm.  Pulmonary:     Effort: Pulmonary effort is normal.     Breath sounds: Normal breath sounds.  Abdominal:     General: Bowel sounds are normal.     Palpations: Abdomen is soft.     Tenderness: There is no abdominal tenderness.  Musculoskeletal:     Cervical back: Neck supple.  Skin:    General: Skin is warm and dry.  Neurological:     Mental Status: She is alert and oriented to person, place, and time.  Psychiatric:        Mood and Affect: Mood normal.           Assessment & Plan:  Hypercholesteremia Assessment & Plan: Continue simvastatin 40 mg daily. Repeat lipid panel pending.   Orders: -     Lipid panel -     Comprehensive metabolic panel  Osteoporosis without  current pathological fracture, unspecified osteoporosis type Assessment & Plan: Continue calcium and vitamin D. Encouraged daily, weight bearing exercise.  Repeat bone density scan due and ordered.   Orders: -     DG Bone Density;  Future  Estrogen deficiency -     DG Bone Density; Future  Screening for colon cancer -     Cologuard  Anxiety and depression Assessment & Plan: Controlled.  Continue sertraline 12.5 mg to 25 mg daily.           Pleas Koch, NP

## 2022-01-24 NOTE — Assessment & Plan Note (Signed)
Continue calcium and vitamin D. Encouraged daily, weight bearing exercise.  Repeat bone density scan due and ordered.

## 2022-01-24 NOTE — Assessment & Plan Note (Signed)
Controlled.  Continue sertraline 12.5 mg to 25 mg daily.

## 2022-01-24 NOTE — Assessment & Plan Note (Signed)
Continue simvastatin 40 mg daily. Repeat lipid panel pending.

## 2022-01-25 ENCOUNTER — Telehealth: Payer: Self-pay | Admitting: Primary Care

## 2022-01-25 NOTE — Telephone Encounter (Signed)
Pt returning call regarding her lab results . Would like a call back # [phone]

## 2022-01-25 NOTE — Telephone Encounter (Signed)
See result note for further documentation.

## 2022-01-26 ENCOUNTER — Other Ambulatory Visit: Payer: Self-pay | Admitting: Primary Care

## 2022-01-26 DIAGNOSIS — E7800 Pure hypercholesterolemia, unspecified: Secondary | ICD-10-CM

## 2022-01-26 MED ORDER — ROSUVASTATIN CALCIUM 5 MG PO TABS: 5.0000 mg | ORAL_TABLET | Freq: Every day | ORAL | 0 refills | Status: DC

## 2022-01-26 NOTE — Progress Notes (Unsigned)
rsu

## 2022-02-14 DIAGNOSIS — H04123 Dry eye syndrome of bilateral lacrimal glands: Secondary | ICD-10-CM | POA: Diagnosis not present

## 2022-02-14 DIAGNOSIS — H43822 Vitreomacular adhesion, left eye: Secondary | ICD-10-CM | POA: Diagnosis not present

## 2022-02-14 DIAGNOSIS — M62830 Muscle spasm of back: Secondary | ICD-10-CM | POA: Diagnosis not present

## 2022-02-14 DIAGNOSIS — H2512 Age-related nuclear cataract, left eye: Secondary | ICD-10-CM | POA: Diagnosis not present

## 2022-02-14 DIAGNOSIS — M9903 Segmental and somatic dysfunction of lumbar region: Secondary | ICD-10-CM | POA: Diagnosis not present

## 2022-02-14 DIAGNOSIS — M9904 Segmental and somatic dysfunction of sacral region: Secondary | ICD-10-CM | POA: Diagnosis not present

## 2022-02-14 DIAGNOSIS — H35341 Macular cyst, hole, or pseudohole, right eye: Secondary | ICD-10-CM | POA: Diagnosis not present

## 2022-02-14 DIAGNOSIS — M9901 Segmental and somatic dysfunction of cervical region: Secondary | ICD-10-CM | POA: Diagnosis not present

## 2022-02-26 ENCOUNTER — Other Ambulatory Visit: Payer: Self-pay | Admitting: Primary Care

## 2022-02-26 DIAGNOSIS — F419 Anxiety disorder, unspecified: Secondary | ICD-10-CM

## 2022-02-26 MED ORDER — SERTRALINE HCL 25 MG PO TABS: 12.5000 mg | ORAL_TABLET | Freq: Every day | ORAL | 2 refills | Status: DC

## 2022-02-26 NOTE — Telephone Encounter (Signed)
From: Jeanella Craze To: Office of Pleas Koch, NP Sent: 02/25/2022 5:56 PM EST Subject: Medication Renewal Request  Refills have been requested for the following medications:   sertraline (ZOLOFT) 25 MG tablet [Jamilet Ambroise K Omnia Dollinger]  Preferred pharmacy: [facility] [place], [place] Delivery method: [employer]

## 2022-03-14 DIAGNOSIS — M62830 Muscle spasm of back: Secondary | ICD-10-CM | POA: Diagnosis not present

## 2022-03-14 DIAGNOSIS — M9904 Segmental and somatic dysfunction of sacral region: Secondary | ICD-10-CM | POA: Diagnosis not present

## 2022-03-14 DIAGNOSIS — M9903 Segmental and somatic dysfunction of lumbar region: Secondary | ICD-10-CM | POA: Diagnosis not present

## 2022-03-14 DIAGNOSIS — M9901 Segmental and somatic dysfunction of cervical region: Secondary | ICD-10-CM | POA: Diagnosis not present

## 2022-03-26 ENCOUNTER — Other Ambulatory Visit (INDEPENDENT_AMBULATORY_CARE_PROVIDER_SITE_OTHER): Payer: Medicare Other

## 2022-03-26 ENCOUNTER — Encounter: Payer: Self-pay | Admitting: Primary Care

## 2022-03-26 DIAGNOSIS — E7800 Pure hypercholesterolemia, unspecified: Secondary | ICD-10-CM | POA: Diagnosis not present

## 2022-03-26 LAB — LIPID PANEL
Cholesterol: 199 mg/dL (ref 0–200)
HDL: 70.7 mg/dL (ref >39.00)
LDL Cholesterol: 102 mg/dL — ABNORMAL HIGH (ref 0–99)
NonHDL: 127.84
Total CHOL/HDL Ratio: 3
Triglycerides: 127 mg/dL (ref 0.0–149.0)
VLDL: 25.4 mg/dL (ref 0.0–40.0)

## 2022-03-26 NOTE — Telephone Encounter (Signed)
error 

## 2022-04-10 DIAGNOSIS — M9903 Segmental and somatic dysfunction of lumbar region: Secondary | ICD-10-CM | POA: Diagnosis not present

## 2022-04-10 DIAGNOSIS — M9904 Segmental and somatic dysfunction of sacral region: Secondary | ICD-10-CM | POA: Diagnosis not present

## 2022-04-10 DIAGNOSIS — M62830 Muscle spasm of back: Secondary | ICD-10-CM | POA: Diagnosis not present

## 2022-04-10 DIAGNOSIS — M9901 Segmental and somatic dysfunction of cervical region: Secondary | ICD-10-CM | POA: Diagnosis not present

## 2022-04-11 DIAGNOSIS — H43822 Vitreomacular adhesion, left eye: Secondary | ICD-10-CM | POA: Diagnosis not present

## 2022-04-11 DIAGNOSIS — H25812 Combined forms of age-related cataract, left eye: Secondary | ICD-10-CM | POA: Diagnosis not present

## 2022-04-11 DIAGNOSIS — H35341 Macular cyst, hole, or pseudohole, right eye: Secondary | ICD-10-CM | POA: Diagnosis not present

## 2022-04-11 DIAGNOSIS — Z961 Presence of intraocular lens: Secondary | ICD-10-CM | POA: Diagnosis not present

## 2022-04-13 ENCOUNTER — Other Ambulatory Visit: Payer: Self-pay | Admitting: Primary Care

## 2022-04-13 DIAGNOSIS — E7800 Pure hypercholesterolemia, unspecified: Secondary | ICD-10-CM

## 2022-04-24 DIAGNOSIS — H2512 Age-related nuclear cataract, left eye: Secondary | ICD-10-CM | POA: Diagnosis not present

## 2022-04-25 ENCOUNTER — Other Ambulatory Visit: Payer: Self-pay | Admitting: Internal Medicine

## 2022-04-25 DIAGNOSIS — E7800 Pure hypercholesterolemia, unspecified: Secondary | ICD-10-CM

## 2022-05-08 ENCOUNTER — Ambulatory Visit
Admission: RE | Admit: 2022-05-08 | Discharge: 2022-05-08 | Disposition: A | Discharge status: Home / Self Care | Payer: Medicare Other | Source: Ambulatory Visit | Attending: Primary Care | Admitting: Primary Care

## 2022-05-08 DIAGNOSIS — M810 Age-related osteoporosis without current pathological fracture: Secondary | ICD-10-CM | POA: Insufficient documentation

## 2022-05-08 DIAGNOSIS — E2839 Other primary ovarian failure: Secondary | ICD-10-CM | POA: Insufficient documentation

## 2022-05-08 DIAGNOSIS — M9904 Segmental and somatic dysfunction of sacral region: Secondary | ICD-10-CM | POA: Diagnosis not present

## 2022-05-08 DIAGNOSIS — M9903 Segmental and somatic dysfunction of lumbar region: Secondary | ICD-10-CM | POA: Diagnosis not present

## 2022-05-08 DIAGNOSIS — M9901 Segmental and somatic dysfunction of cervical region: Secondary | ICD-10-CM | POA: Diagnosis not present

## 2022-05-08 DIAGNOSIS — M62830 Muscle spasm of back: Secondary | ICD-10-CM | POA: Diagnosis not present

## 2022-05-21 DIAGNOSIS — Z1211 Encounter for screening for malignant neoplasm of colon: Secondary | ICD-10-CM | POA: Diagnosis not present

## 2022-05-28 LAB — COLOGUARD: COLOGUARD: NEGATIVE

## 2022-06-05 DIAGNOSIS — M62830 Muscle spasm of back: Secondary | ICD-10-CM | POA: Diagnosis not present

## 2022-06-05 DIAGNOSIS — M9901 Segmental and somatic dysfunction of cervical region: Secondary | ICD-10-CM | POA: Diagnosis not present

## 2022-06-05 DIAGNOSIS — M9904 Segmental and somatic dysfunction of sacral region: Secondary | ICD-10-CM | POA: Diagnosis not present

## 2022-06-05 DIAGNOSIS — M9903 Segmental and somatic dysfunction of lumbar region: Secondary | ICD-10-CM | POA: Diagnosis not present

## 2022-06-18 DIAGNOSIS — H524 Presbyopia: Secondary | ICD-10-CM | POA: Diagnosis not present

## 2022-06-18 DIAGNOSIS — H35341 Macular cyst, hole, or pseudohole, right eye: Secondary | ICD-10-CM | POA: Diagnosis not present

## 2022-06-18 DIAGNOSIS — H5203 Hypermetropia, bilateral: Secondary | ICD-10-CM | POA: Diagnosis not present

## 2022-06-18 DIAGNOSIS — H52223 Regular astigmatism, bilateral: Secondary | ICD-10-CM | POA: Diagnosis not present

## 2022-06-18 DIAGNOSIS — Z961 Presence of intraocular lens: Secondary | ICD-10-CM | POA: Diagnosis not present

## 2022-07-03 DIAGNOSIS — M9903 Segmental and somatic dysfunction of lumbar region: Secondary | ICD-10-CM | POA: Diagnosis not present

## 2022-07-03 DIAGNOSIS — M9904 Segmental and somatic dysfunction of sacral region: Secondary | ICD-10-CM | POA: Diagnosis not present

## 2022-07-03 DIAGNOSIS — M9901 Segmental and somatic dysfunction of cervical region: Secondary | ICD-10-CM | POA: Diagnosis not present

## 2022-07-03 DIAGNOSIS — M62830 Muscle spasm of back: Secondary | ICD-10-CM | POA: Diagnosis not present

## 2022-07-09 IMAGING — MG MM DIGITAL SCREENING BILAT W/ TOMO AND CAD
6 of 10 series · 6 of 30 positions shown · non-contrast
Comparison: Previous exam(s).

CLINICAL DATA: Screening.

EXAM:
DIGITAL SCREENING BILATERAL MAMMOGRAM WITH TOMOSYNTHESIS AND CAD
TECHNIQUE: Bilateral screening digital craniocaudal and mediolateral oblique
mammograms were obtained. Bilateral screening digital breast
tomosynthesis was performed. The images were evaluated with
computer-aided detection.

[R CC synth-2D]
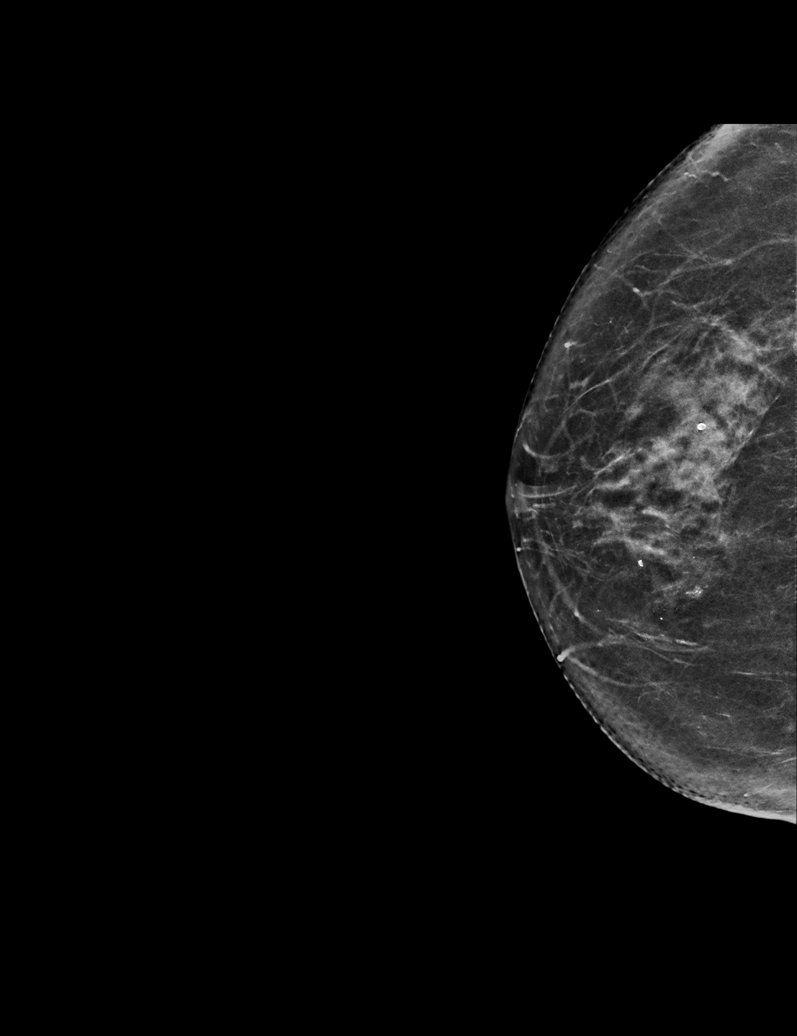

[L MLO synth-2D]
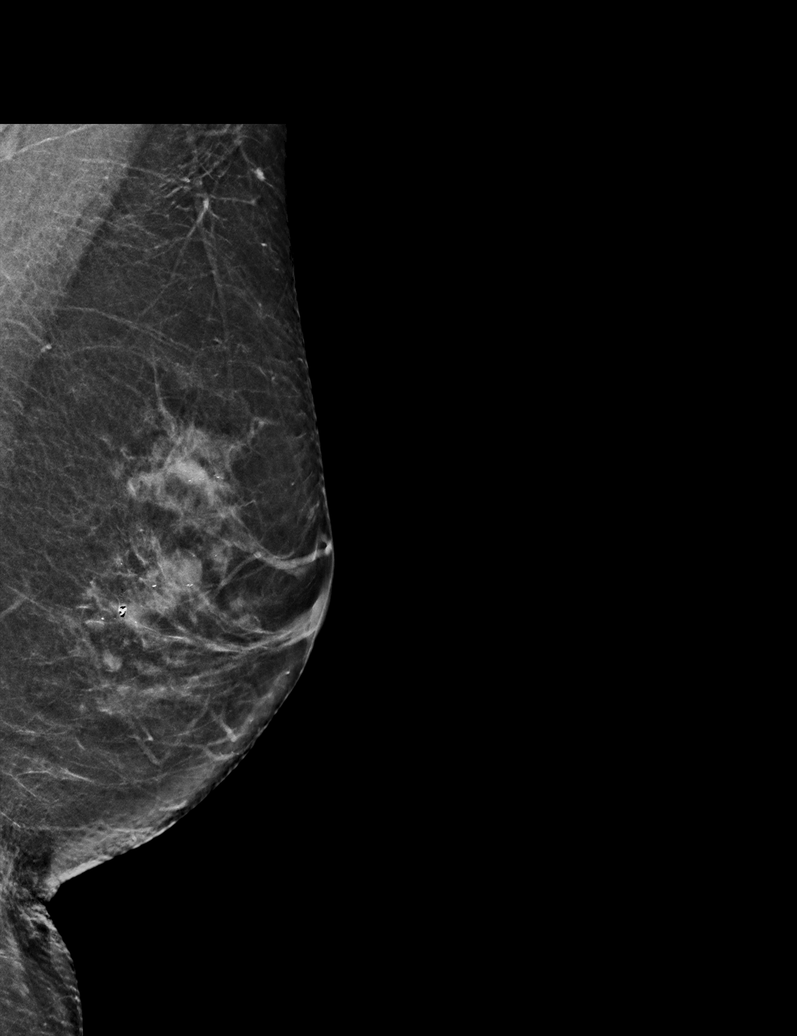

[L CC synth-2D]
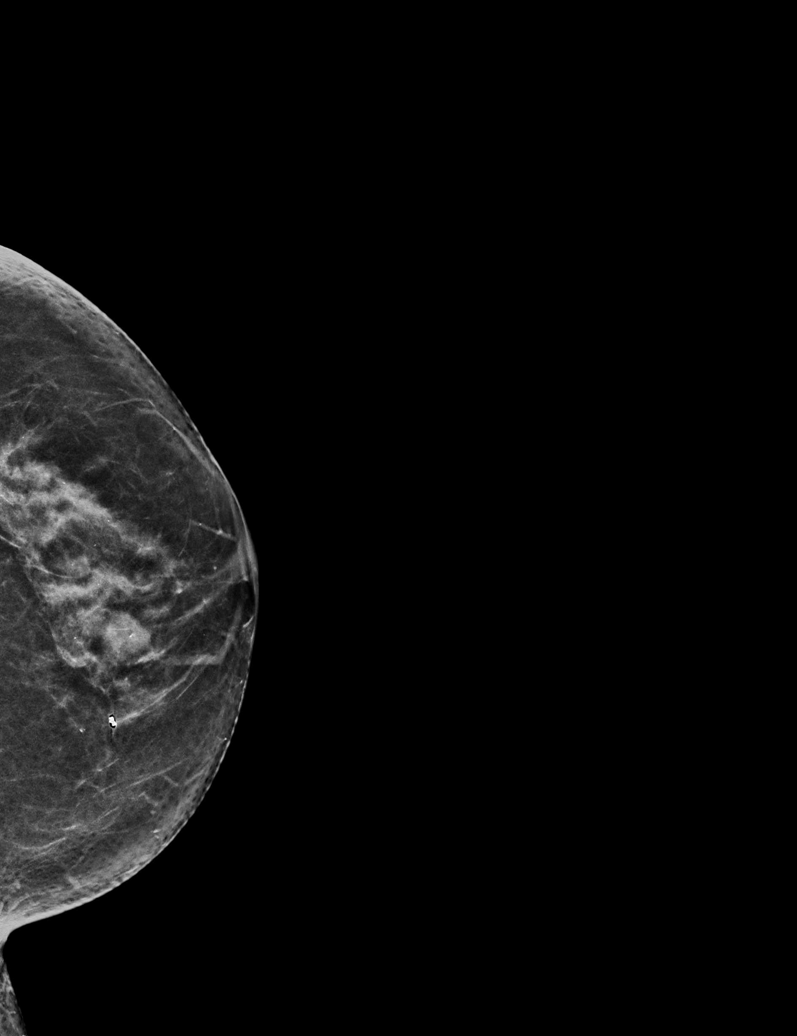

[R MLO synth-2D]
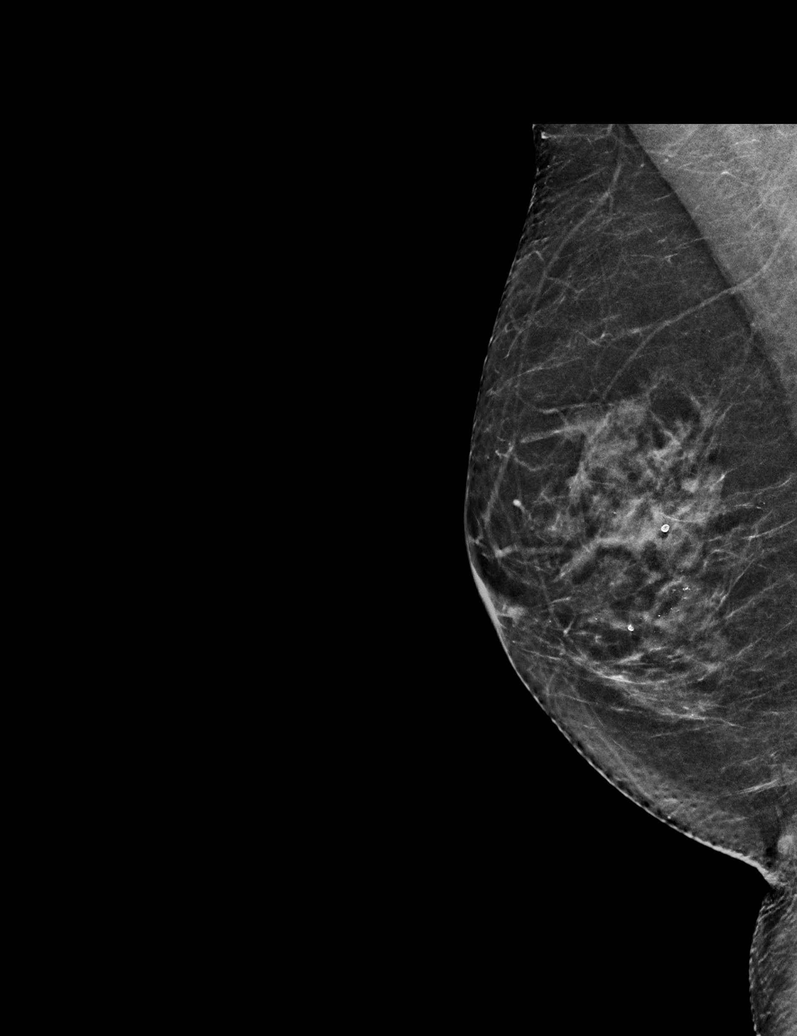

[L XCCL synth-2D]
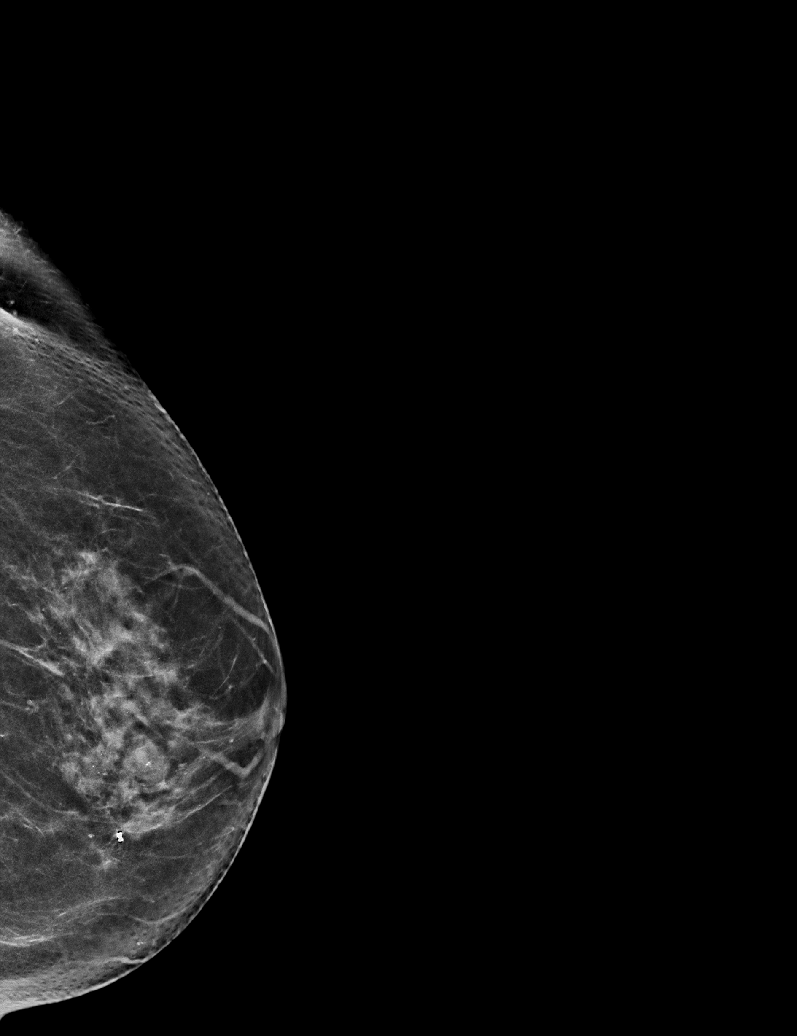

[L XCCL tomo · tomo slice 31/62.0]
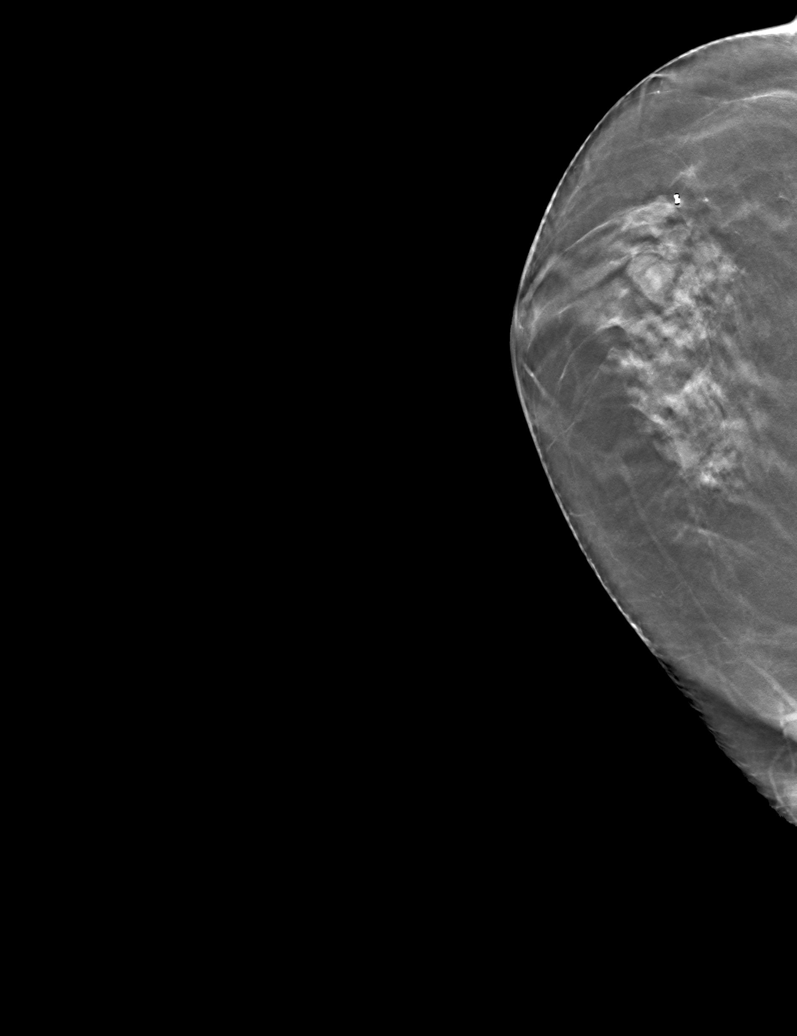

[6 of 30 positions shown; findings below may reference images not displayed]

ACR Breast Density Category b: There are scattered areas of
fibroglandular density.
FINDINGS: In the left breast, a possible mass warrants further evaluation. In
the right breast, no findings suspicious for malignancy.
IMPRESSION: Further evaluation is suggested for a possible mass in the left
breast.

RECOMMENDATION:
Diagnostic mammogram and possibly ultrasound of the left breast.
(Code:[id number]-X-44M)

The patient will be contacted regarding the findings, and additional
imaging will be scheduled.

BI-RADS CATEGORY  0: Incomplete. Need additional imaging evaluation
and/or prior mammograms for comparison.

## 2022-07-31 DIAGNOSIS — M9901 Segmental and somatic dysfunction of cervical region: Secondary | ICD-10-CM | POA: Diagnosis not present

## 2022-07-31 DIAGNOSIS — M9904 Segmental and somatic dysfunction of sacral region: Secondary | ICD-10-CM | POA: Diagnosis not present

## 2022-07-31 DIAGNOSIS — M9903 Segmental and somatic dysfunction of lumbar region: Secondary | ICD-10-CM | POA: Diagnosis not present

## 2022-07-31 DIAGNOSIS — M62830 Muscle spasm of back: Secondary | ICD-10-CM | POA: Diagnosis not present

## 2022-08-15 DIAGNOSIS — H04123 Dry eye syndrome of bilateral lacrimal glands: Secondary | ICD-10-CM | POA: Diagnosis not present

## 2022-08-15 DIAGNOSIS — H35341 Macular cyst, hole, or pseudohole, right eye: Secondary | ICD-10-CM | POA: Diagnosis not present

## 2022-08-15 DIAGNOSIS — H43822 Vitreomacular adhesion, left eye: Secondary | ICD-10-CM | POA: Diagnosis not present

## 2022-08-28 DIAGNOSIS — M9904 Segmental and somatic dysfunction of sacral region: Secondary | ICD-10-CM | POA: Diagnosis not present

## 2022-08-28 DIAGNOSIS — M62830 Muscle spasm of back: Secondary | ICD-10-CM | POA: Diagnosis not present

## 2022-08-28 DIAGNOSIS — M9903 Segmental and somatic dysfunction of lumbar region: Secondary | ICD-10-CM | POA: Diagnosis not present

## 2022-08-28 DIAGNOSIS — M9901 Segmental and somatic dysfunction of cervical region: Secondary | ICD-10-CM | POA: Diagnosis not present

## 2022-09-25 DIAGNOSIS — M9903 Segmental and somatic dysfunction of lumbar region: Secondary | ICD-10-CM | POA: Diagnosis not present

## 2022-09-25 DIAGNOSIS — M9904 Segmental and somatic dysfunction of sacral region: Secondary | ICD-10-CM | POA: Diagnosis not present

## 2022-09-25 DIAGNOSIS — M62830 Muscle spasm of back: Secondary | ICD-10-CM | POA: Diagnosis not present

## 2022-09-25 DIAGNOSIS — M9901 Segmental and somatic dysfunction of cervical region: Secondary | ICD-10-CM | POA: Diagnosis not present

## 2022-10-23 DIAGNOSIS — M62830 Muscle spasm of back: Secondary | ICD-10-CM | POA: Diagnosis not present

## 2022-10-23 DIAGNOSIS — M9901 Segmental and somatic dysfunction of cervical region: Secondary | ICD-10-CM | POA: Diagnosis not present

## 2022-10-23 DIAGNOSIS — M9904 Segmental and somatic dysfunction of sacral region: Secondary | ICD-10-CM | POA: Diagnosis not present

## 2022-10-23 DIAGNOSIS — M9903 Segmental and somatic dysfunction of lumbar region: Secondary | ICD-10-CM | POA: Diagnosis not present

## 2022-11-14 ENCOUNTER — Other Ambulatory Visit: Payer: Self-pay | Admitting: Primary Care

## 2022-11-14 DIAGNOSIS — Z1231 Encounter for screening mammogram for malignant neoplasm of breast: Secondary | ICD-10-CM

## 2022-11-20 DIAGNOSIS — M9903 Segmental and somatic dysfunction of lumbar region: Secondary | ICD-10-CM | POA: Diagnosis not present

## 2022-11-20 DIAGNOSIS — M9901 Segmental and somatic dysfunction of cervical region: Secondary | ICD-10-CM | POA: Diagnosis not present

## 2022-11-20 DIAGNOSIS — Z23 Encounter for immunization: Secondary | ICD-10-CM | POA: Diagnosis not present

## 2022-11-20 DIAGNOSIS — M62830 Muscle spasm of back: Secondary | ICD-10-CM | POA: Diagnosis not present

## 2022-11-20 DIAGNOSIS — M9904 Segmental and somatic dysfunction of sacral region: Secondary | ICD-10-CM | POA: Diagnosis not present

## 2022-11-26 DIAGNOSIS — H43822 Vitreomacular adhesion, left eye: Secondary | ICD-10-CM | POA: Diagnosis not present

## 2022-11-26 DIAGNOSIS — Z961 Presence of intraocular lens: Secondary | ICD-10-CM | POA: Diagnosis not present

## 2022-11-26 DIAGNOSIS — H35341 Macular cyst, hole, or pseudohole, right eye: Secondary | ICD-10-CM | POA: Diagnosis not present

## 2022-12-21 DIAGNOSIS — M9901 Segmental and somatic dysfunction of cervical region: Secondary | ICD-10-CM | POA: Diagnosis not present

## 2022-12-21 DIAGNOSIS — M9904 Segmental and somatic dysfunction of sacral region: Secondary | ICD-10-CM | POA: Diagnosis not present

## 2022-12-21 DIAGNOSIS — M9903 Segmental and somatic dysfunction of lumbar region: Secondary | ICD-10-CM | POA: Diagnosis not present

## 2022-12-21 DIAGNOSIS — M62830 Muscle spasm of back: Secondary | ICD-10-CM | POA: Diagnosis not present

## 2022-12-24 ENCOUNTER — Ambulatory Visit
Admission: RE | Admit: 2022-12-24 | Discharge: 2022-12-24 | Disposition: A | Discharge status: Home / Self Care | Payer: Medicare Other | Source: Ambulatory Visit | Attending: Primary Care | Admitting: Primary Care

## 2022-12-24 DIAGNOSIS — Z1231 Encounter for screening mammogram for malignant neoplasm of breast: Secondary | ICD-10-CM | POA: Insufficient documentation

## 2023-01-02 DIAGNOSIS — H43812 Vitreous degeneration, left eye: Secondary | ICD-10-CM | POA: Diagnosis not present

## 2023-01-02 DIAGNOSIS — Z961 Presence of intraocular lens: Secondary | ICD-10-CM | POA: Diagnosis not present

## 2023-01-02 DIAGNOSIS — H35341 Macular cyst, hole, or pseudohole, right eye: Secondary | ICD-10-CM | POA: Diagnosis not present

## 2023-01-11 ENCOUNTER — Ambulatory Visit (INDEPENDENT_AMBULATORY_CARE_PROVIDER_SITE_OTHER): Payer: Medicare Other

## 2023-01-11 VITALS — Ht 60.0 in | Wt 110.0 lb

## 2023-01-11 DIAGNOSIS — Z0000 Encounter for general adult medical examination without abnormal findings: Secondary | ICD-10-CM | POA: Diagnosis not present

## 2023-01-11 NOTE — Patient Instructions (Signed)
Samantha Diaz , Thank you for taking time to come for your Medicare Wellness Visit. I appreciate your ongoing commitment to your health goals. Please review the following plan we discussed and let me know if I can assist you in the future.   Referrals/Orders/Follow-Ups/Clinician Recommendations: none  This is a list of the screening recommended for you and due dates:  Health Maintenance  Topic Date Due   Flu Shot  08/16/2022   COVID-19 Vaccine (3 - 2024-25 season) 09/16/2022   Zoster (Shingles) Vaccine (1 of 2) 04/11/2023*   Medicare Annual Wellness Visit  01/11/2024   Mammogram  12/23/2024   Cologuard (Stool DNA test)  05/20/2025   DTaP/Tdap/Td vaccine (2 - Td or Tdap) 01/19/2027   Pneumonia Vaccine  Completed   DEXA scan (bone density measurement)  Completed   Hepatitis C Screening  Completed   HPV Vaccine  Aged Out  *Topic was postponed. The date shown is not the original due date.    Advanced directives: (Copy Requested) Please bring a copy of your health care power of attorney and living will to the office to be added to your chart at your convenience.  Next Medicare Annual Wellness Visit scheduled for next year: Yes  01/13/2024 @ 10:50am televisit

## 2023-01-11 NOTE — Progress Notes (Signed)
Subjective:   Samantha Diaz is a 67 y.o. female who presents for Medicare Annual (Subsequent) preventive examination.  Visit Complete: Virtual I connected with  Samantha Diaz on 01/11/23 by a audio enabled telemedicine application and verified that I am speaking with the correct person using two identifiers.  Patient Location: Home  Provider Location: Home Office  I discussed the limitations of evaluation and management by telemedicine. The patient expressed understanding and agreed to proceed.  Vital Signs: Because this visit was a virtual/telehealth visit, some criteria may be missing or patient reported. Any vitals not documented were not able to be obtained and vitals that have been documented are patient reported.  Patient Medicare AWV questionnaire was completed by the patient on (not done); I have confirmed that all information answered by patient is correct and no changes since this date.  Cardiac Risk Factors include: advanced age (>31men, >31 women);sedentary lifestyle;dyslipidemia    Objective:    Today's Vitals   01/11/23 0932  Weight: 110 lb (49.9 kg)  Height: 5' (1.524 m)   Body mass index is 21.48 kg/m.     01/11/2023    9:49 AM 01/09/2022    9:17 AM 01/05/2021    2:57 PM  Advanced Directives  Does Patient Have a Medical Advance Directive? Yes Yes Yes  Type of Estate agent of [place];Living will Healthcare Power of [place];Living will Healthcare Power of [place];Living will  Does patient want to make changes to medical advance directive?  No - Patient declined Yes (MAU/Ambulatory/Procedural Areas - Information given)  Copy of Healthcare Power of Attorney in Chart? No - copy requested No - copy requested     Current Medications (verified) Outpatient Encounter Medications as of 01/11/2023  Medication Sig   Calcium-Magnesium-Vitamin D (CALCIUM MAGNESIUM PO) Take by mouth.   Coenzyme Q10 (COQ10) 100 MG CAPS Take 1 capsule by  mouth daily.   Echinacea 400 MG CAPS Take 1 capsule by mouth daily.   rosuvastatin (CRESTOR) 5 MG tablet TAKE 1 TABLET(5 MG) BY MOUTH DAILY FOR CHOLESTEROL   sertraline (ZOLOFT) 25 MG tablet Take 0.5 tablets (12.5 mg total) by mouth daily. for anxiety and depression.   VITAMIN E PO    No facility-administered encounter medications on file as of 01/11/2023.   Allergies (verified) Sulfa antibiotics and Latex   History: Past Medical History:  Diagnosis Date   History of UTI    Hyperlipidemia    Murmur    Past Surgical History:  Procedure Laterality Date   BREAST BIOPSY Left 2014   neg-Fibrocystic changes   CHOLECYSTECTOMY  2013   Family History  Problem Relation Age of Onset   Cancer Father        skin   Hyperlipidemia Father    Hypertension Mother    Cancer Maternal Grandfather        lung   Breast cancer Neg Hx    Social History   Socioeconomic History   Marital status: Married    Spouse name: Not on file   Number of children: Not on file   Years of education: Not on file   Highest education level: Not on file  Occupational History   Not on file  Tobacco Use   Smoking status: Former    Current packs/day: 0.00    Types: Cigarettes    Quit date: 01/16/2004    Years since quitting: 19.0   Smokeless tobacco: Never  Substance and Sexual Activity   Alcohol use: Yes  Alcohol/week: 0.0 standard drinks of alcohol    Comment: rarely   Drug use: No   Sexual activity: Not on file  Other Topics Concern   Not on file  Social History Narrative   Married.   No children.   Enjoys reading, traveling to the mountains.   Works in Clinical biochemist.   Social Drivers of Corporate investment banker Strain: Low Risk  (01/11/2023)   Overall Financial Resource Strain (CARDIA)    Difficulty of Paying Living Expenses: Not hard at all  Food Insecurity: No Food Insecurity (01/11/2023)   Hunger Vital Sign    Worried About Running Out of Food in the Last Year: Never true    Ran  Out of Food in the Last Year: Never true  Transportation Needs: No Transportation Needs (01/11/2023)   PRAPARE - Administrator, Civil Service (Medical): No    Lack of Transportation (Non-Medical): No  Physical Activity: Inactive (01/11/2023)   Exercise Vital Sign    Days of Exercise per Week: 0 days    Minutes of Exercise per Session: 0 min  Stress: No Stress Concern Present (01/11/2023)   [employer] of Occupational Health - Occupational Stress Questionnaire    Feeling of Stress : Only a little  Social Connections: Moderately Isolated (01/11/2023)   Social Connection and Isolation Panel [NHANES]    Frequency of Communication with Friends and Family: Once a week    Frequency of Social Gatherings with Friends and Family: More than three times a week    Attends Religious Services: Never    Database administrator or Organizations: No    Attends Engineer, structural: Never    Marital Status: Married    Tobacco Counseling Counseling given: Not Answered  Clinical Intake:  Pre-visit preparation completed: No  Pain : No/denies pain    BMI - recorded: 21.48 Nutritional Status: BMI of 19-24  Normal Nutritional Risks: None Diabetes: No  How often do you need to have someone help you when you read instructions, pamphlets, or other written materials from your doctor or pharmacy?: 1 - Never  Interpreter Needed?: No  Comments: lives with husband Information entered by :: B.Anel Purohit,LPN   Activities of Daily Living    01/11/2023    9:50 AM  In your present state of health, do you have any difficulty performing the following activities:  Hearing? 0  Vision? 0  Difficulty concentrating or making decisions? 0  Walking or climbing stairs? 0  Dressing or bathing? 0  Doing errands, shopping? 0  Preparing Food and eating ? N  Using the Toilet? N  In the past six months, have you accidently leaked urine? N  Do you have problems with loss of bowel control?  N  Managing your Medications? N  Managing your Finances? N  Housekeeping or managing your Housekeeping? N    Patient Care Team: Doreene Nest, NP as PCP - General (Nurse Practitioner) Lemar Livings Merrily Pew, MD as Consulting Physician (General Surgery) Domingo Madeira, OD (Optometry)  Indicate any recent Medical Services you may have received from other than [facility] providers in the past year (date may be approximate).     Assessment:   This is a routine wellness examination for [place].  Hearing/Vision screen Hearing Screening - Comments:: Pt says her hearing has decreased small amt but hears adequately Vision Screening - Comments:: Pt says her vision is good:readers only Dr Edger House Dr Marchelle Gearing -cataract surgery Dr Chrys Racer specialist  Goals Addressed               This Visit's Progress     DIET - REDUCE SUGAR INTAKE (pt-stated)   Not on track     Snack healthier      Increase physical activity   Not on track     Patient Stated   Not on track     Would like to maintain current routine.        Depression Screen    01/11/2023    9:45 AM 01/09/2022    9:16 AM 01/20/2021    8:01 AM 01/05/2021    3:03 PM 10/26/2020    8:26 AM 01/01/2020    1:59 PM 12/31/2018    8:45 AM  PHQ 2/9 Scores  PHQ - 2 Score 2 0 0 1 1 0 2  PHQ- 9 Score 5  0  4      Fall Risk    01/11/2023    9:37 AM 01/09/2022    9:17 AM 01/05/2021    3:00 PM 01/01/2020    1:59 PM 12/31/2018    8:46 AM  Fall Risk   Falls in the past year? 0 0 0 0 0  Number falls in past yr: 0 0 0  0  Injury with Fall? 0 0 0  0  Risk for fall due to : No Fall Risks  No Fall Risks    Follow up Education provided;Falls prevention discussed Falls evaluation completed Falls prevention discussed      MEDICARE RISK AT HOME: Medicare Risk at Home Any stairs in or around the home?: No If so, are there any without handrails?: No Home free of loose throw rugs in walkways, pet beds, electrical cords, etc?:  Yes Adequate lighting in your home to reduce risk of falls?: Yes Life alert?: No Use of a cane, walker or w/c?: No Grab bars in the bathroom?: Yes Shower chair or bench in shower?: Yes Elevated toilet seat or a handicapped toilet?: No  TIMED UP AND GO:  Was the test performed?  No    Cognitive Function:        01/11/2023    9:53 AM 01/09/2022    9:20 AM  6CIT Screen  What Year? 0 points 0 points  What month? 0 points 0 points  What time? 0 points 0 points  Count back from 20 0 points 0 points  Months in reverse 0 points 0 points  Repeat phrase 0 points 0 points  Total Score 0 points 0 points    Immunizations Immunization History  Administered Date(s) Administered   Fluad Quad(high Dose 65+) 01/01/2020, 10/26/2020, 11/28/2022   Influenza Inj Mdck Quad Pf 11/09/2017   Influenza,inj,Quad PF,6+ Mos 12/12/2015, 11/10/2016, 11/02/2018   Influenza-Unspecified 11/10/2016, 12/20/2021   Janssen (J&J) SARS-COV-2 Vaccination 04/27/2019, 12/05/2019   Pneumococcal Conjugate-13 12/31/2018   Pneumococcal Polysaccharide-23 01/01/2020   Tdap 01/18/2017   Zoster, Live 11/03/2015    TDAP status: Up to date  Flu Vaccine status: Up to date  Pneumococcal vaccine status: Up to date  Covid-19 vaccine status: Completed vaccines  Qualifies for Shingles Vaccine? Yes   Zostavax completed No   Shingrix Completed?: No.    Education has been provided regarding the importance of this vaccine. Patient has been advised to call insurance company to determine out of pocket expense if they have not yet received this vaccine. Advised may also receive vaccine at local pharmacy or Health Dept. Verbalized acceptance and understanding.  Screening Tests Health  Maintenance  Topic Date Due   Zoster Vaccines- Shingrix (1 of 2) 04/11/2023 (Originally 05/15/2003)   COVID-19 Vaccine (3 - 2024-25 season) 04/15/2023 (Originally 09/16/2022)   Medicare Annual Wellness (AWV)  01/11/2024   MAMMOGRAM  12/23/2024    Fecal DNA (Cologuard)  05/20/2025   DTaP/Tdap/Td (2 - Td or Tdap) 01/19/2027   Pneumonia Vaccine 70+ Years old  Completed   INFLUENZA VACCINE  Completed   DEXA SCAN  Completed   Hepatitis C Screening  Completed   HPV VACCINES  Aged Out    Health Maintenance  There are no preventive care reminders to display for this patient.   Colorectal cancer screening: Type of screening: Cologuard. Completed 05/21/2022. Repeat every 3 years  Mammogram status: Completed 12/24/2022. Repeat every year  Bone Density status: Completed 05/08/2022. Results reflect: Bone density results: OSTEOPOROSIS. Repeat every 3 years.  Lung Cancer Screening: (Low Dose CT Chest recommended if Age 82-80 years, 20 pack-year currently smoking OR have quit w/in 15years.) does not qualify.   Lung Cancer Screening Referral: no  Additional Screening:  Hepatitis C Screening: does not qualify; Completed 07/04/2018  Vision Screening: Recommended annual ophthalmology exams for early detection of glaucoma and other disorders of the eye. Is the patient up to date with their annual eye exam?  Yes  Who is the provider or what is the name of the office in which the patient attends annual eye exams? Dr Adline Peals If pt is not established with a provider, would they like to be referred to a provider to establish care? No .   Dental Screening: Recommended annual dental exams for proper oral hygiene  Diabetic Foot Exam: n/a  Community Resource Referral / Chronic Care Management: CRR required this visit?  No   CCM required this visit?  Appt made w/PCP for PE    Plan:     I have personally reviewed and noted the following in the patient's chart:   Medical and social history Use of alcohol, tobacco or illicit drugs  Current medications and supplements including opioid prescriptions. Patient is not currently taking opioid prescriptions. Functional ability and status Nutritional status Physical activity Advanced directives List  of other physicians Hospitalizations, surgeries, and ER visits in previous 12 months Vitals Screenings to include cognitive, depression, and falls Referrals and appointments  In addition, I have reviewed and discussed with patient certain preventive protocols, quality metrics, and best practice recommendations. A written personalized care plan for preventive services as well as general preventive health recommendations were provided to patient.    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 She relays she is managing alright right now with that and intermittent left hip pain as well as hands going to sleep at night. She has no other concerns or questions at this time.

## 2023-01-18 DIAGNOSIS — M9904 Segmental and somatic dysfunction of sacral region: Secondary | ICD-10-CM | POA: Diagnosis not present

## 2023-01-18 DIAGNOSIS — M9901 Segmental and somatic dysfunction of cervical region: Secondary | ICD-10-CM | POA: Diagnosis not present

## 2023-01-18 DIAGNOSIS — M62830 Muscle spasm of back: Secondary | ICD-10-CM | POA: Diagnosis not present

## 2023-01-18 DIAGNOSIS — M9903 Segmental and somatic dysfunction of lumbar region: Secondary | ICD-10-CM | POA: Diagnosis not present

## 2023-01-28 DIAGNOSIS — H35341 Macular cyst, hole, or pseudohole, right eye: Secondary | ICD-10-CM | POA: Diagnosis not present

## 2023-01-28 DIAGNOSIS — H43812 Vitreous degeneration, left eye: Secondary | ICD-10-CM | POA: Diagnosis not present

## 2023-01-31 ENCOUNTER — Ambulatory Visit: Payer: Medicare Other | Admitting: Primary Care

## 2023-01-31 ENCOUNTER — Encounter: Payer: Self-pay | Admitting: Primary Care

## 2023-01-31 VITALS — BP 122/74 | HR 64 | Temp 97.2°F | Ht 60.0 in | Wt 113.0 lb

## 2023-01-31 DIAGNOSIS — M810 Age-related osteoporosis without current pathological fracture: Secondary | ICD-10-CM | POA: Diagnosis not present

## 2023-01-31 DIAGNOSIS — E2839 Other primary ovarian failure: Secondary | ICD-10-CM

## 2023-01-31 DIAGNOSIS — E7800 Pure hypercholesterolemia, unspecified: Secondary | ICD-10-CM | POA: Diagnosis not present

## 2023-01-31 DIAGNOSIS — F419 Anxiety disorder, unspecified: Secondary | ICD-10-CM

## 2023-01-31 DIAGNOSIS — F32A Depression, unspecified: Secondary | ICD-10-CM

## 2023-01-31 LAB — CBC
HCT: 42.3 % (ref 36.0–46.0)
Hemoglobin: 14.1 g/dL (ref 12.0–15.0)
MCHC: 33.2 g/dL (ref 30.0–36.0)
MCV: 94 fL (ref 78.0–100.0)
Platelets: 286 10*3/uL (ref 150.0–400.0)
RBC: 4.51 Mil/uL (ref 3.87–5.11)
RDW: 12.7 % (ref 11.5–15.5)
WBC: 4.7 10*3/uL (ref 4.0–10.5)

## 2023-01-31 LAB — COMPREHENSIVE METABOLIC PANEL
ALT: 40 U/L — ABNORMAL HIGH (ref 0–35)
AST: 30 U/L (ref 0–37)
Albumin: 4.9 g/dL (ref 3.5–5.2)
Alkaline Phosphatase: 84 U/L (ref 39–117)
BUN: 16 mg/dL (ref 6–23)
CO2: 28 meq/L (ref 19–32)
Calcium: 9.9 mg/dL (ref 8.4–10.5)
Chloride: 103 meq/L (ref 96–112)
Creatinine, Ser: 0.8 mg/dL (ref 0.40–1.20)
GFR: 75.02 mL/min (ref >60.00)
Glucose, Bld: 98 mg/dL (ref 70–99)
Potassium: 4.4 meq/L (ref 3.5–5.1)
Sodium: 140 meq/L (ref 135–145)
Total Bilirubin: 0.6 mg/dL (ref 0.2–1.2)
Total Protein: 7.4 g/dL (ref 6.0–8.3)

## 2023-01-31 LAB — LIPID PANEL
Cholesterol: 249 mg/dL — ABNORMAL HIGH (ref 0–200)
HDL: 80.6 mg/dL (ref >39.00)
LDL Cholesterol: 140 mg/dL — ABNORMAL HIGH (ref 0–99)
NonHDL: 167.9
Total CHOL/HDL Ratio: 3
Triglycerides: 138 mg/dL (ref 0.0–149.0)
VLDL: 27.6 mg/dL (ref 0.0–40.0)

## 2023-01-31 NOTE — Assessment & Plan Note (Signed)
Reviewed bone density scan from April 2024. Given the drastic decline, would like to repeat in April 2025.  She will check with her insurance to see if this is covered. Orders placed.  Continue calcium and vitamin D. She declines treatment previously

## 2023-01-31 NOTE — Assessment & Plan Note (Signed)
Repeat lipid panel pending. Continue rosuvastatin 5 mg daily.

## 2023-01-31 NOTE — Assessment & Plan Note (Addendum)
Controlled.  Remain off Zoloft 12.5 mg daily. Continue to monitor.

## 2023-01-31 NOTE — Patient Instructions (Signed)
Stop by the lab prior to leaving today. I will notify you of your results once received.   Call your insurance to see if they will pay for the bone density scan in April 2025.  If so, schedule your bone density scan for April 2025.  It was a pleasure to see you today!

## 2023-01-31 NOTE — Progress Notes (Signed)
Subjective:    Patient ID: Samantha Diaz, female    DOB: 12-Jan-1956, 68 y.o.   MRN: [id number]  HPI  Samantha Diaz is a very pleasant 68 y.o. female with a history of osteoporosis, hyperlipidemia, anxiety depression who presents today for follow-up of chronic conditions.  Immunizations: -Tetanus: Completed in 2019 -Influenza: Completed this season  -Shingles: Completed Zostavax -Pneumonia: Completed Prevnar 13 in 2020, Pneumovax 23 in 2021  Mammogram: Completed December 2024 Bone Density Scan: Completed April 2024  Colonoscopy: Completed Cologuard in May 2024, negative   BP Readings from Last 3 Encounters:  01/31/23 122/74  01/24/22 122/78  01/20/21 120/62   1) Anxiety and Depression: Chronic. Historically managed on Zoloft 12.5 mg daily. Around Christmas she stopped her Zoloft, did not notify us of this. She stopped taking as she felt as thought she no longer needed. She's been focusing on herself by talking hikes and walks. She resumed her crafting.   Overall she feels much better.   2) Osteoporosis: Chronic. Last bone density scan was in April 2024, t score of -2.9 which is a decline from -2.7in 2021. Osteoporosis worse to the AP spine.   She is taking calcium and vitamin D daily. She does weekly hikes.  She declined prescription treatment previously.  Review of Systems  Respiratory:  Negative for shortness of breath.   Cardiovascular:  Negative for chest pain.  Gastrointestinal:  Negative for constipation and diarrhea.  Neurological:  Negative for dizziness and headaches.  Psychiatric/Behavioral:  The patient is not nervous/anxious.          Past Medical History:  Diagnosis Date   History of UTI    Hyperlipidemia    Murmur     Social History   Socioeconomic History   Marital status: Married    Spouse name: Not on file   Number of children: Not on file   Years of education: Not on file   Highest education level: Not on file  Occupational  History   Not on file  Tobacco Use   Smoking status: Former    Current packs/day: 0.00    Types: Cigarettes    Quit date: 01/16/2004    Years since quitting: 19.0   Smokeless tobacco: Never  Substance and Sexual Activity   Alcohol use: Yes    Alcohol/week: 0.0 standard drinks of alcohol    Comment: rarely   Drug use: No   Sexual activity: Not on file  Other Topics Concern   Not on file  Social History Narrative   Married.   No children.   Enjoys reading, traveling to the mountains.   Works in Clinical biochemist.   Social Drivers of Corporate investment banker Strain: Low Risk  (01/11/2023)   Overall Financial Resource Strain (CARDIA)    Difficulty of Paying Living Expenses: Not hard at all  Food Insecurity: No Food Insecurity (01/11/2023)   Hunger Vital Sign    Worried About Running Out of Food in the Last Year: Never true    Ran Out of Food in the Last Year: Never true  Transportation Needs: No Transportation Needs (01/11/2023)   PRAPARE - Administrator, Civil Service (Medical): No    Lack of Transportation (Non-Medical): No  Physical Activity: Inactive (01/11/2023)   Exercise Vital Sign    Days of Exercise per Week: 0 days    Minutes of Exercise per Session: 0 min  Stress: No Stress Concern Present (01/11/2023)   [employer] of Occupational  Health - Occupational Stress Questionnaire    Feeling of Stress : Only a little  Social Connections: Moderately Isolated (01/11/2023)   Social Connection and Isolation Panel [NHANES]    Frequency of Communication with Friends and Family: Once a week    Frequency of Social Gatherings with Friends and Family: More than three times a week    Attends Religious Services: Never    Database administrator or Organizations: No    Attends Banker Meetings: Never    Marital Status: Married  Catering manager Violence: Not At Risk (01/11/2023)   Humiliation, Afraid, Rape, and Kick questionnaire    Fear of  Current or Ex-Partner: No    Emotionally Abused: No    Physically Abused: No    Sexually Abused: No    Past Surgical History:  Procedure Laterality Date   BREAST BIOPSY Left 2014   neg-Fibrocystic changes   CATARACT EXTRACTION Bilateral 2024   CHOLECYSTECTOMY  2013    Family History  Problem Relation Age of Onset   Cancer Father        skin   Hyperlipidemia Father    Hypertension Mother    Cancer Maternal Grandfather        lung   Breast cancer Neg Hx     Allergies  Allergen Reactions   Sulfa Antibiotics Swelling    Tongue swelling   Latex Rash    Current Outpatient Medications on File Prior to Visit  Medication Sig Dispense Refill   Calcium-Magnesium-Vitamin D (CALCIUM MAGNESIUM PO) Take by mouth.     Coenzyme Q10 (COQ10) 100 MG CAPS Take 1 capsule by mouth daily.     Echinacea 400 MG CAPS Take 1 capsule by mouth daily.     rosuvastatin (CRESTOR) 5 MG tablet TAKE 1 TABLET(5 MG) BY MOUTH DAILY FOR CHOLESTEROL 90 tablet 2   VITAMIN E PO      No current facility-administered medications on file prior to visit.    BP 122/74   Pulse 64   Temp (!) 97.2 F (36.2 C) (Temporal)   Ht 5' (1.524 m)   Wt 113 lb (51.3 kg)   SpO2 97%   BMI 22.07 kg/m  Objective:   Physical Exam Cardiovascular:     Rate and Rhythm: Normal rate and regular rhythm.  Pulmonary:     Effort: Pulmonary effort is normal.     Breath sounds: Normal breath sounds.  Abdominal:     General: Bowel sounds are normal.     Palpations: Abdomen is soft.     Tenderness: There is no abdominal tenderness.  Musculoskeletal:     Cervical back: Neck supple.  Skin:    General: Skin is warm and dry.  Neurological:     Mental Status: She is alert and oriented to person, place, and time.  Psychiatric:        Mood and Affect: Mood normal.           Assessment & Plan:  Hypercholesteremia Assessment & Plan: Repeat lipid panel pending. Continue rosuvastatin 5 mg daily.  Orders: -      Comprehensive metabolic panel -     CBC -     Lipid panel  Osteoporosis without current pathological fracture, unspecified osteoporosis type Assessment & Plan: Reviewed bone density scan from April 2024. Given the drastic decline, would like to repeat in April 2025.  She will check with her insurance to see if this is covered. Orders placed.  Continue calcium and vitamin D. She  declines treatment previously   Orders: -     DG Bone Density; Future  Anxiety and depression Assessment & Plan: Controlled.  Remain off Zoloft 12.5 mg daily. Continue to monitor.    Estrogen deficiency -     DG Bone Density; Future        Doreene Nest, NP

## 2023-02-01 DIAGNOSIS — E7800 Pure hypercholesterolemia, unspecified: Secondary | ICD-10-CM

## 2023-02-03 MED ORDER — ROSUVASTATIN CALCIUM 10 MG PO TABS: 10.0000 mg | ORAL_TABLET | Freq: Every day | ORAL | 3 refills | Status: DC

## 2023-02-19 DIAGNOSIS — M9904 Segmental and somatic dysfunction of sacral region: Secondary | ICD-10-CM | POA: Diagnosis not present

## 2023-02-19 DIAGNOSIS — M9903 Segmental and somatic dysfunction of lumbar region: Secondary | ICD-10-CM | POA: Diagnosis not present

## 2023-02-19 DIAGNOSIS — M9901 Segmental and somatic dysfunction of cervical region: Secondary | ICD-10-CM | POA: Diagnosis not present

## 2023-02-19 DIAGNOSIS — M62830 Muscle spasm of back: Secondary | ICD-10-CM | POA: Diagnosis not present

## 2023-03-01 DIAGNOSIS — H04123 Dry eye syndrome of bilateral lacrimal glands: Secondary | ICD-10-CM | POA: Diagnosis not present

## 2023-03-01 DIAGNOSIS — H43812 Vitreous degeneration, left eye: Secondary | ICD-10-CM | POA: Diagnosis not present

## 2023-03-01 DIAGNOSIS — H43822 Vitreomacular adhesion, left eye: Secondary | ICD-10-CM | POA: Diagnosis not present

## 2023-03-01 DIAGNOSIS — H43392 Other vitreous opacities, left eye: Secondary | ICD-10-CM | POA: Diagnosis not present

## 2023-03-01 DIAGNOSIS — H35341 Macular cyst, hole, or pseudohole, right eye: Secondary | ICD-10-CM | POA: Diagnosis not present

## 2023-03-19 DIAGNOSIS — M9904 Segmental and somatic dysfunction of sacral region: Secondary | ICD-10-CM | POA: Diagnosis not present

## 2023-03-19 DIAGNOSIS — M62830 Muscle spasm of back: Secondary | ICD-10-CM | POA: Diagnosis not present

## 2023-03-19 DIAGNOSIS — M9901 Segmental and somatic dysfunction of cervical region: Secondary | ICD-10-CM | POA: Diagnosis not present

## 2023-03-19 DIAGNOSIS — M9903 Segmental and somatic dysfunction of lumbar region: Secondary | ICD-10-CM | POA: Diagnosis not present

## 2023-04-16 DIAGNOSIS — M9901 Segmental and somatic dysfunction of cervical region: Secondary | ICD-10-CM | POA: Diagnosis not present

## 2023-04-16 DIAGNOSIS — M62830 Muscle spasm of back: Secondary | ICD-10-CM | POA: Diagnosis not present

## 2023-04-16 DIAGNOSIS — M9904 Segmental and somatic dysfunction of sacral region: Secondary | ICD-10-CM | POA: Diagnosis not present

## 2023-04-16 DIAGNOSIS — M9903 Segmental and somatic dysfunction of lumbar region: Secondary | ICD-10-CM | POA: Diagnosis not present

## 2023-05-02 ENCOUNTER — Other Ambulatory Visit: Payer: Self-pay | Admitting: Primary Care

## 2023-05-14 DIAGNOSIS — M62830 Muscle spasm of back: Secondary | ICD-10-CM | POA: Diagnosis not present

## 2023-05-14 DIAGNOSIS — M9903 Segmental and somatic dysfunction of lumbar region: Secondary | ICD-10-CM | POA: Diagnosis not present

## 2023-05-14 DIAGNOSIS — M9904 Segmental and somatic dysfunction of sacral region: Secondary | ICD-10-CM | POA: Diagnosis not present

## 2023-05-14 DIAGNOSIS — M9901 Segmental and somatic dysfunction of cervical region: Secondary | ICD-10-CM | POA: Diagnosis not present

## 2023-05-16 ENCOUNTER — Other Ambulatory Visit

## 2023-05-16 DIAGNOSIS — E7800 Pure hypercholesterolemia, unspecified: Secondary | ICD-10-CM | POA: Diagnosis not present

## 2023-05-16 LAB — HEPATIC FUNCTION PANEL
ALT: 30 U/L (ref 0–35)
AST: 24 U/L (ref 0–37)
Albumin: 4.7 g/dL (ref 3.5–5.2)
Alkaline Phosphatase: 81 U/L (ref 39–117)
Bilirubin, Direct: 0.1 mg/dL (ref 0.0–0.3)
Total Bilirubin: 0.8 mg/dL (ref 0.2–1.2)
Total Protein: 7.2 g/dL (ref 6.0–8.3)

## 2023-05-16 LAB — LIPID PANEL
Cholesterol: 202 mg/dL — ABNORMAL HIGH (ref 0–200)
HDL: 71.8 mg/dL (ref >39.00)
LDL Cholesterol: 109 mg/dL — ABNORMAL HIGH (ref 0–99)
NonHDL: 130.44
Total CHOL/HDL Ratio: 3
Triglycerides: 105 mg/dL (ref 0.0–149.0)
VLDL: 21 mg/dL (ref 0.0–40.0)

## 2023-06-11 DIAGNOSIS — M9903 Segmental and somatic dysfunction of lumbar region: Secondary | ICD-10-CM | POA: Diagnosis not present

## 2023-06-11 DIAGNOSIS — M9901 Segmental and somatic dysfunction of cervical region: Secondary | ICD-10-CM | POA: Diagnosis not present

## 2023-06-11 DIAGNOSIS — M62830 Muscle spasm of back: Secondary | ICD-10-CM | POA: Diagnosis not present

## 2023-06-11 DIAGNOSIS — M9904 Segmental and somatic dysfunction of sacral region: Secondary | ICD-10-CM | POA: Diagnosis not present

## 2023-07-01 DIAGNOSIS — Z961 Presence of intraocular lens: Secondary | ICD-10-CM | POA: Diagnosis not present

## 2023-07-01 DIAGNOSIS — H35341 Macular cyst, hole, or pseudohole, right eye: Secondary | ICD-10-CM | POA: Diagnosis not present

## 2023-07-01 DIAGNOSIS — H43812 Vitreous degeneration, left eye: Secondary | ICD-10-CM | POA: Diagnosis not present

## 2023-07-09 DIAGNOSIS — M9904 Segmental and somatic dysfunction of sacral region: Secondary | ICD-10-CM | POA: Diagnosis not present

## 2023-07-09 DIAGNOSIS — M9903 Segmental and somatic dysfunction of lumbar region: Secondary | ICD-10-CM | POA: Diagnosis not present

## 2023-07-09 DIAGNOSIS — M9901 Segmental and somatic dysfunction of cervical region: Secondary | ICD-10-CM | POA: Diagnosis not present

## 2023-07-09 DIAGNOSIS — M62830 Muscle spasm of back: Secondary | ICD-10-CM | POA: Diagnosis not present

## 2023-07-30 ENCOUNTER — Other Ambulatory Visit: Payer: Self-pay | Admitting: Primary Care

## 2023-07-30 DIAGNOSIS — Z1231 Encounter for screening mammogram for malignant neoplasm of breast: Secondary | ICD-10-CM

## 2023-08-07 DIAGNOSIS — M9904 Segmental and somatic dysfunction of sacral region: Secondary | ICD-10-CM | POA: Diagnosis not present

## 2023-08-07 DIAGNOSIS — M62830 Muscle spasm of back: Secondary | ICD-10-CM | POA: Diagnosis not present

## 2023-08-07 DIAGNOSIS — M9901 Segmental and somatic dysfunction of cervical region: Secondary | ICD-10-CM | POA: Diagnosis not present

## 2023-08-07 DIAGNOSIS — M9903 Segmental and somatic dysfunction of lumbar region: Secondary | ICD-10-CM | POA: Diagnosis not present

## 2023-09-04 DIAGNOSIS — M9901 Segmental and somatic dysfunction of cervical region: Secondary | ICD-10-CM | POA: Diagnosis not present

## 2023-09-04 DIAGNOSIS — M9903 Segmental and somatic dysfunction of lumbar region: Secondary | ICD-10-CM | POA: Diagnosis not present

## 2023-09-04 DIAGNOSIS — M62830 Muscle spasm of back: Secondary | ICD-10-CM | POA: Diagnosis not present

## 2023-09-04 DIAGNOSIS — M9904 Segmental and somatic dysfunction of sacral region: Secondary | ICD-10-CM | POA: Diagnosis not present

## 2023-10-02 DIAGNOSIS — M9904 Segmental and somatic dysfunction of sacral region: Secondary | ICD-10-CM | POA: Diagnosis not present

## 2023-10-02 DIAGNOSIS — M9901 Segmental and somatic dysfunction of cervical region: Secondary | ICD-10-CM | POA: Diagnosis not present

## 2023-10-02 DIAGNOSIS — M62830 Muscle spasm of back: Secondary | ICD-10-CM | POA: Diagnosis not present

## 2023-10-02 DIAGNOSIS — M9903 Segmental and somatic dysfunction of lumbar region: Secondary | ICD-10-CM | POA: Diagnosis not present

## 2023-10-30 DIAGNOSIS — M9904 Segmental and somatic dysfunction of sacral region: Secondary | ICD-10-CM | POA: Diagnosis not present

## 2023-10-30 DIAGNOSIS — M62830 Muscle spasm of back: Secondary | ICD-10-CM | POA: Diagnosis not present

## 2023-10-30 DIAGNOSIS — M9901 Segmental and somatic dysfunction of cervical region: Secondary | ICD-10-CM | POA: Diagnosis not present

## 2023-10-30 DIAGNOSIS — M9903 Segmental and somatic dysfunction of lumbar region: Secondary | ICD-10-CM | POA: Diagnosis not present

## 2023-11-13 DIAGNOSIS — Z23 Encounter for immunization: Secondary | ICD-10-CM | POA: Diagnosis not present

## 2023-12-04 DIAGNOSIS — M9904 Segmental and somatic dysfunction of sacral region: Secondary | ICD-10-CM | POA: Diagnosis not present

## 2023-12-04 DIAGNOSIS — M62830 Muscle spasm of back: Secondary | ICD-10-CM | POA: Diagnosis not present

## 2023-12-04 DIAGNOSIS — M9901 Segmental and somatic dysfunction of cervical region: Secondary | ICD-10-CM | POA: Diagnosis not present

## 2023-12-04 DIAGNOSIS — M9903 Segmental and somatic dysfunction of lumbar region: Secondary | ICD-10-CM | POA: Diagnosis not present

## 2023-12-25 ENCOUNTER — Ambulatory Visit
Admission: RE | Admit: 2023-12-25 | Discharge: 2023-12-25 | Disposition: A | Discharge status: Home / Self Care | Source: Ambulatory Visit | Attending: Primary Care | Admitting: Primary Care

## 2023-12-25 DIAGNOSIS — Z1231 Encounter for screening mammogram for malignant neoplasm of breast: Secondary | ICD-10-CM | POA: Insufficient documentation

## 2023-12-31 ENCOUNTER — Ambulatory Visit: Payer: Self-pay | Admitting: Primary Care

## 2024-01-01 DIAGNOSIS — M9903 Segmental and somatic dysfunction of lumbar region: Secondary | ICD-10-CM | POA: Diagnosis not present

## 2024-01-01 DIAGNOSIS — M9901 Segmental and somatic dysfunction of cervical region: Secondary | ICD-10-CM | POA: Diagnosis not present

## 2024-01-01 DIAGNOSIS — M9904 Segmental and somatic dysfunction of sacral region: Secondary | ICD-10-CM | POA: Diagnosis not present

## 2024-01-01 DIAGNOSIS — M62830 Muscle spasm of back: Secondary | ICD-10-CM | POA: Diagnosis not present

## 2024-01-13 ENCOUNTER — Ambulatory Visit: Payer: Medicare Other

## 2024-01-13 VITALS — Ht 60.0 in | Wt 113.0 lb

## 2024-01-13 DIAGNOSIS — Z0000 Encounter for general adult medical examination without abnormal findings: Secondary | ICD-10-CM | POA: Diagnosis not present

## 2024-01-13 NOTE — Patient Instructions (Addendum)
 Samantha Diaz,  Thank you for taking the time for your Medicare Wellness Visit. I appreciate your continued commitment to your health goals. Please review the care plan we discussed, and feel free to reach out if I can assist you further.  Please note that Annual Wellness Visits do not include a physical exam. Some assessments may be limited, especially if the visit was conducted virtually. If needed, we may recommend an in-person follow-up with your provider.  Ongoing Care Seeing your primary care provider every 3 to 6 months helps us  monitor your health and provide consistent, personalized care. Last office visit on 1/16/205.  Remember to discuss the Shingrix vaccine with your provider during your next office visit.  Aim for 30 minutes of exercise or brisk walking, 6-8 glasses of water, and 5 servings of fruits and vegetables each day.   Referrals If a referral was made during today's visit and you haven't received any updates within two weeks, please contact the referred provider directly to check on the status.  Recommended Screenings:  Health Maintenance  Topic Date Due   Zoster (Shingles) Vaccine (1 of 2) 05/15/2003   COVID-19 Vaccine (3 - 2025-26 season) 09/16/2023   Medicare Annual Wellness Visit  01/12/2025   Cologuard (Stool DNA test)  05/20/2025   Breast Cancer Screening  12/24/2025   DTaP/Tdap/Td vaccine (2 - Td or Tdap) 01/19/2027   Pneumococcal Vaccine for age over 32  Completed   Flu Shot  Completed   Osteoporosis screening with Bone Density Scan  Completed   Hepatitis C Screening  Completed   Meningitis B Vaccine  Aged Out       01/13/2024   10:50 AM  Advanced Directives  Does Patient Have a Medical Advance Directive? Yes  Type of Estate Agent of [place];Living will  Copy of Healthcare Power of Attorney in Chart? No - copy requested    Vision: Annual vision screenings are recommended for early detection of glaucoma, cataracts, and diabetic  retinopathy. These exams can also reveal signs of chronic conditions such as diabetes and high blood pressure.  Dental: Annual dental screenings help detect early signs of oral cancer, gum disease, and other conditions linked to overall health, including heart disease and diabetes.  Please see the attached documents for additional preventive care recommendations.

## 2024-01-13 NOTE — Progress Notes (Addendum)
 "  Chief Complaint  Patient presents with   Medicare Wellness     Subjective:   Samantha Diaz is a 68 y.o. female who presents for a Medicare Annual Wellness Visit.  Visit info / Clinical Intake: Medicare Wellness Visit Type:: Subsequent Annual Wellness Visit Persons participating in visit and providing information:: patient Medicare Wellness Visit Mode:: Video Since this visit was completed virtually, some vitals may be partially provided or unavailable. Missing vitals are due to the limitations of the virtual format.: Unable to obtain vitals - no equipment If Telephone or Video please confirm:: I connected with patient using audio/video enable telemedicine. I verified patient identity with two identifiers, discussed telehealth limitations, and patient agreed to proceed. Patient Location:: Home Provider Location:: Home Interpreter Needed?: No Pre-visit prep was completed: yes AWV questionnaire completed by patient prior to visit?: no Living arrangements:: lives with spouse/significant other Patient's Overall Health Status Rating: very good Typical amount of pain: none Does pain affect daily life?: no Are you currently prescribed opioids?: no  Dietary Habits and Nutritional Risks How many meals a day?: 2 (sometimes 3) Eats fruit and vegetables daily?: (!) no (veggies daily) Most meals are obtained by: preparing own meals In the last 2 weeks, have you had any of the following?: none Diabetic:: no  Functional Status Activities of Daily Living (to include ambulation/medication): Independent Ambulation: Independent with device- listed below Home Assistive Devices/Equipment: Eyeglasses Medication Administration: Independent Home Management (perform basic housework or laundry): Independent Manage your own finances?: yes Primary transportation is: driving Concerns about vision?: no *vision screening is required for WTM* Concerns about hearing?: no  Fall Screening Falls in  the past year?: 0 Number of falls in past year: 0 Was there an injury with Fall?: 0 Fall Risk Category Calculator: 0 Patient Fall Risk Level: Low Fall Risk  Fall Risk Patient at Risk for Falls Due to: No Fall Risks Fall risk Follow up: Falls evaluation completed; Falls prevention discussed  Home and Transportation Safety: All rugs have non-skid backing?: yes All stairs or steps have railings?: yes (coming into home) Grab bars in the bathtub or shower?: yes Have non-skid surface in bathtub or shower?: (!) no Good home lighting?: yes Regular seat belt use?: yes Hospital stays in the last year:: no  Cognitive Assessment Difficulty concentrating, remembering, or making decisions? : yes Will 6CIT or Mini Cog be Completed: yes What year is it?: 0 points What month is it?: 0 points Give patient an address phrase to remember (5 components): [address] [place] About what time is it?: 0 points Count backwards from 20 to 1: 0 points Say the months of the year in reverse: 0 points Repeat the address phrase from earlier: 0 points 6 CIT Score: 0 points  Advance Directives (For Healthcare) Does Patient Have a Medical Advance Directive?: Yes Type of Advance Directive: Healthcare Power of [place]; Living will Copy of Healthcare Power of Attorney in Chart?: No - copy requested Copy of Living Will in Chart?: No - copy requested  Reviewed/Updated  Reviewed/Updated: Reviewed All (Medical, Surgical, Family, Medications, Allergies, Care Teams, Patient Goals)    Allergies (verified) Sulfa antibiotics and Latex   Current Medications (verified) Outpatient Encounter Medications as of 01/13/2024  Medication Sig   Calcium -Magnesium-Vitamin D  (CALCIUM  MAGNESIUM PO) Take by mouth.   Coenzyme Q10 (COQ10) 100 MG CAPS Take 1 capsule by mouth daily.   Echinacea 400 MG CAPS Take 1 capsule by mouth daily.   rosuvastatin  (CRESTOR ) 10 MG tablet Take 1  tablet (10 mg total) by mouth daily. for  cholesterol.   VITAMIN E PO    No facility-administered encounter medications on file as of 01/13/2024.    History: Past Medical History:  Diagnosis Date   History of UTI    Hyperlipidemia    Murmur    Past Surgical History:  Procedure Laterality Date   BREAST BIOPSY Left 2014   neg-Fibrocystic changes   CATARACT EXTRACTION Bilateral 2024   CHOLECYSTECTOMY  2013   Family History  Problem Relation Age of Onset   Cancer Father        skin   Hyperlipidemia Father    Hypertension Mother    Cancer Maternal Grandfather        lung   Breast cancer Neg Hx    Social History   Occupational History   Occupation: [employer] RET[employer]  Tobacco Use   Smoking status: Former    Current packs/day: 0.00    Types: Cigarettes    Quit date: 01/16/2004    Years since quitting: 20.0   Smokeless tobacco: Never  Vaping Use   Vaping status: Never Used  Substance and Sexual Activity   Alcohol use: Yes    Alcohol/week: 0.0 standard drinks of alcohol    Comment: rarely   Drug use: No   Sexual activity: Not on file   Tobacco Counseling Counseling given: Not Answered  SDOH Screenings   Food Insecurity: No Food Insecurity (01/13/2024)  Housing: Unknown (01/13/2024)  Transportation Needs: No Transportation Needs (01/13/2024)  Utilities: Not At Risk (01/13/2024)  Alcohol Screen: Low Risk (01/11/2023)  Depression (PHQ2-9): Low Risk (01/13/2024)  Financial Resource Strain: Low Risk (01/11/2023)  Physical Activity: Inactive (01/13/2024)  Social Connections: Socially Isolated (01/13/2024)  Stress: No Stress Concern Present (01/13/2024)  Tobacco Use: Medium Risk (01/13/2024)  Health Literacy: Adequate Health Literacy (01/13/2024)   See flowsheets for full screening details  Depression Screen PHQ 2 & 9 Depression Scale- Over the past 2 weeks, how often have you been bothered by any of the following problems? Little interest or pleasure in doing things: 0 Feeling down, depressed, or hopeless  (PHQ Adolescent also includes...irritable): 0 PHQ-2 Total Score: 0 Trouble falling or staying asleep, or sleeping too much: 0 Feeling tired or having little energy: 0 Poor appetite or overeating (PHQ Adolescent also includes...weight loss): 0 Feeling bad about yourself - or that you are a failure or have let yourself or your family down: 0 Trouble concentrating on things, such as reading the newspaper or watching television (PHQ Adolescent also includes...like school work): 0 Moving or speaking so slowly that other people could have noticed. Or the opposite - being so fidgety or restless that you have been moving around a lot more than usual: 0 Thoughts that you would be better off dead, or of hurting yourself in some way: 0 PHQ-9 Total Score: 0 If you checked off any problems, how difficult have these problems made it for you to do your work, take care of things at home, or get along with other people?: Not difficult at all  Depression Treatment Depression Interventions/Treatment : [id number] Score <4 Follow-up Not Indicated     Goals Addressed             This Visit's Progress    Patient Stated       Would like to maintain current routine. /2025-be more mobile             Objective:    Today's Vitals   01/13/24 1049  Weight: 113 lb (51.3 kg)  Height: 5' (1.524 m)   Body mass index is 22.07 kg/m.  Hearing/Vision screen Hearing Screening - Comments:: Denies hearing difficulties   Vision Screening - Comments:: Wears eyeglasses for reading/Dr. Nice/UTD Immunizations and Health Maintenance Health Maintenance  Topic Date Due   Zoster Vaccines- Shingrix (1 of 2) 05/15/2003   COVID-19 Vaccine (3 - 2025-26 season) 09/16/2023   Medicare Annual Wellness (AWV)  01/12/2025   Fecal DNA (Cologuard)  05/20/2025   Mammogram  12/24/2025   DTaP/Tdap/Td (2 - Td or Tdap) 01/19/2027   Pneumococcal Vaccine: 50+ Years  Completed   Influenza Vaccine  Completed   Bone Density Scan   Completed   Hepatitis C Screening  Completed   Meningococcal B Vaccine  Aged Out        Assessment/Plan:  This is a routine wellness examination for [place].  Patient Care Team: Gretta Comer POUR, NP as PCP - General (Nurse Practitioner) Dessa Reyes ORN, MD as Consulting Physician (General Surgery) Laurice Francis NOVAK, OD [facility]  I have personally reviewed and noted the following in the patients chart:   Medical and social history Use of alcohol, tobacco or illicit drugs  Current medications and supplements including opioid prescriptions. Functional ability and status Nutritional status Physical activity Advanced directives List of other physicians Hospitalizations, surgeries, and ER visits in previous 12 months Vitals Screenings to include cognitive, depression, and falls Referrals and appointments  No orders of the defined types were placed in this encounter.  In addition, I have reviewed and discussed with patient certain preventive protocols, quality metrics, and best practice recommendations. A written personalized care plan for preventive services as well as general preventive health recommendations were provided to patient.   Samantha Diaz, CMA   01/13/2024   Return in 1 year (on 01/12/2025).  After Visit Summary: (MyChart) Due to this being a telephonic visit, the after visit summary with patients personalized plan was offered to patient via MyChart   Nurse Notes: Patient is due for a Shingrix vaccine, however, she would like to discuss during her next office visit.  She stated that she will call the office to schedule her next appointment. Patient had no other concerns to address today. "

## 2024-02-08 ENCOUNTER — Other Ambulatory Visit: Payer: Self-pay | Admitting: Primary Care

## 2024-02-08 DIAGNOSIS — E7800 Pure hypercholesterolemia, unspecified: Secondary | ICD-10-CM

## 2024-02-09 NOTE — Telephone Encounter (Signed)
Office visit required for further refills  

## 2024-02-20 ENCOUNTER — Ambulatory Visit: Admitting: Primary Care

## 2024-02-20 ENCOUNTER — Encounter: Payer: Self-pay | Admitting: Primary Care

## 2024-02-20 VITALS — BP 122/76 | HR 74 | Temp 97.9°F | Ht 59.0 in | Wt 114.2 lb

## 2024-02-20 DIAGNOSIS — E2839 Other primary ovarian failure: Secondary | ICD-10-CM

## 2024-02-20 DIAGNOSIS — E7800 Pure hypercholesterolemia, unspecified: Secondary | ICD-10-CM

## 2024-02-20 DIAGNOSIS — M810 Age-related osteoporosis without current pathological fracture: Secondary | ICD-10-CM

## 2024-02-20 DIAGNOSIS — F419 Anxiety disorder, unspecified: Secondary | ICD-10-CM

## 2024-02-20 NOTE — Progress Notes (Signed)
 "  Subjective:    Patient ID: Samantha Diaz, female    DOB: August 11, 1955, 69 y.o.   MRN: [id number]  Samantha Diaz is a very pleasant 69 y.o. female with history of osteoporosis, hyperlipidemia, anxiety depression who presents today for follow-up chronic conditions.  Immunizations: -Tetanus: Completed in 2019 -Influenza: Completed this season  -Shingles: Completed Zostavax -Pneumonia: Completed in 2021  Mammogram: Completed in December 2025 Bone Density Scan: Completed in April 2024  Colonoscopy: Completed Cologuard in 2024, due 2027    1) Hyperlipidemia: Currently managed on rosuvastatin  10 mg daily.  She is due for repeat lipid panel today  2) Osteoporosis: Bone density scan from April 2024 with osteoporosis to AP spine, osteopenia to left femoral neck, left total femur.  In April 2024 it was recommended to start alendronate along with calcium  and vitamin D .  She kindly declined a prescription for alendronate.  She is exercising intermittently, tries to walk.     Review of Systems  Respiratory:  Negative for shortness of breath.   Cardiovascular:  Negative for chest pain.  Gastrointestinal:  Negative for constipation and diarrhea.  Neurological:  Negative for dizziness and headaches.  Psychiatric/Behavioral:  The patient is not nervous/anxious.          Past Medical History:  Diagnosis Date   History of UTI    Hyperlipidemia    Murmur     Social History   Socioeconomic History   Marital status: Married    Spouse name: Nancyann   Number of children: 0   Years of education: Not on file   Highest education level: Not on file  Occupational History   Occupation: [employer] RET[employer]  Tobacco Use   Smoking status: Former    Current packs/day: 0.00    Types: Cigarettes    Quit date: 01/16/2004    Years since quitting: 20.1   Smokeless tobacco: Never  Vaping Use   Vaping status: Never Used  Substance and Sexual Activity   Alcohol use: Yes    Alcohol/week: 0.0  standard drinks of alcohol    Comment: rarely   Drug use: No   Sexual activity: Not on file  Other Topics Concern   Not on file  Social History Narrative   Married.   No children.   Enjoys reading, traveling to the mountains.   Works in Clinical Biochemist.      Lives with husband-has 2 cats/2025   Social Drivers of Health   Tobacco Use: Medium Risk (02/20/2024)   Patient History    Smoking Tobacco Use: Former    Smokeless Tobacco Use: Never    Passive Exposure: Not on file  Financial Resource Strain: Low Risk (01/11/2023)   Overall Financial Resource Strain (CARDIA)    Difficulty of Paying Living Expenses: Not hard at all  Food Insecurity: No Food Insecurity (01/13/2024)   Epic    Worried About Programme Researcher, Broadcasting/film/video in the Last Year: Never true    Ran Out of Food in the Last Year: Never true  Transportation Needs: No Transportation Needs (01/13/2024)   Epic    Lack of Transportation (Medical): No    Lack of Transportation (Non-Medical): No  Physical Activity: Inactive (01/13/2024)   Exercise Vital Sign    Days of Exercise per Week: 0 days    Minutes of Exercise per Session: 0 min  Stress: No Stress Concern Present (01/13/2024)   [employer] of Occupational Health - Occupational Stress Questionnaire    Feeling of Stress: Only a  little  Social Connections: Socially Isolated (01/13/2024)   Social Connection and Isolation Panel    Frequency of Communication with Friends and Family: Once a week    Frequency of Social Gatherings with Friends and Family: Once a week    Attends Religious Services: Never    Database Administrator or Organizations: No    Attends Banker Meetings: Never    Marital Status: Married  Catering Manager Violence: Not At Risk (01/13/2024)   Epic    Fear of Current or Ex-Partner: No    Emotionally Abused: No    Physically Abused: No    Sexually Abused: No  Depression (PHQ2-9): Low Risk (01/13/2024)   Depression (PHQ2-9)    PHQ-2  Score: 0  Alcohol Screen: Low Risk (01/11/2023)   Alcohol Screen    Last Alcohol Screening Score (AUDIT): 1  Housing: Unknown (01/13/2024)   Epic    Unable to Pay for Housing in the Last Year: No    Number of Times Moved in the Last Year: Not on file    Homeless in the Last Year: No  Utilities: Not At Risk (01/13/2024)   Epic    Threatened with loss of utilities: No  Health Literacy: Adequate Health Literacy (01/13/2024)   B1300 Health Literacy    Frequency of need for help with medical instructions: Never    Past Surgical History:  Procedure Laterality Date   BREAST BIOPSY Left 2014   neg-Fibrocystic changes   CATARACT EXTRACTION Bilateral 2024   CHOLECYSTECTOMY  2013    Family History  Problem Relation Age of Onset   Cancer Father        skin   Hyperlipidemia Father    Hypertension Mother    Cancer Maternal Grandfather        lung   Breast cancer Neg Hx     Allergies[1]  Medications Ordered Prior to Encounter[2]  BP 122/76   Pulse 74   Temp 97.9 F (36.6 C) (Oral)   Ht 4' 11 (1.499 m)   Wt 114 lb 4 oz (51.8 kg)   SpO2 94%   BMI 23.08 kg/m  Objective:   Physical Exam Cardiovascular:     Rate and Rhythm: Normal rate and regular rhythm.  Pulmonary:     Effort: Pulmonary effort is normal.     Breath sounds: Normal breath sounds.  Musculoskeletal:     Cervical back: Neck supple.  Skin:    General: Skin is warm and dry.  Neurological:     Mental Status: She is alert and oriented to person, place, and time.  Psychiatric:        Mood and Affect: Mood normal.     Physical Exam        Assessment & Plan:  Hypercholesteremia Assessment & Plan: Repeat lipid panel pending.  Continue rosuvastatin  5 mg daily.  Orders: -     Comprehensive metabolic panel with GFR; Future -     Lipid panel; Future  Osteoporosis without current pathological fracture, unspecified osteoporosis type Assessment & Plan: Repeat bone density scan ordered and  pending.  Continue calcium  and vitamin D . Encourage weightbearing exercise  Orders: -     DG Bone Density; Future  Estrogen deficiency -     DG Bone Density; Future  Anxiety and depression Assessment & Plan: No concerns today  Continue to monitor     Assessment and Plan Assessment & Plan         Comer MARLA Gaskins, NP       [  1]  Allergies Allergen Reactions   Sulfa Antibiotics Swelling    Tongue swelling   Latex Rash  [2]  Current Outpatient Medications on File Prior to Visit  Medication Sig Dispense Refill   Calcium -Magnesium-Vitamin D  (CALCIUM  MAGNESIUM PO) Take by mouth.     Coenzyme Q10 (COQ10) 100 MG CAPS Take 1 capsule by mouth daily.     Echinacea 400 MG CAPS Take 1 capsule by mouth daily.     rosuvastatin  (CRESTOR ) 10 MG tablet TAKE 1 TABLET(10 MG) BY MOUTH DAILY FOR CHOLESTEROL 30 tablet 0   VITAMIN E PO      No current facility-administered medications on file prior to visit.   "

## 2024-02-20 NOTE — Assessment & Plan Note (Signed)
 No concerns today. Continue to monitor.

## 2024-02-20 NOTE — Assessment & Plan Note (Signed)
 Repeat bone density scan ordered and pending.  Continue calcium  and vitamin D . Encourage weightbearing exercise

## 2024-02-20 NOTE — Patient Instructions (Addendum)
 Schedule a lab only appointment for tomorrow.  [facility] to [facility] your [facility].    It was a pleasure to see you today!

## 2024-02-20 NOTE — Assessment & Plan Note (Signed)
 Repeat lipid panel pending. Continue rosuvastatin 5 mg daily.

## 2024-02-21 ENCOUNTER — Ambulatory Visit: Payer: Self-pay | Admitting: Primary Care

## 2024-02-21 ENCOUNTER — Other Ambulatory Visit

## 2024-02-21 DIAGNOSIS — E7800 Pure hypercholesterolemia, unspecified: Secondary | ICD-10-CM

## 2024-02-21 LAB — COMPREHENSIVE METABOLIC PANEL WITH GFR
ALT: 34 U/L (ref 3–35)
AST: 26 U/L (ref 5–37)
Albumin: 4.7 g/dL (ref 3.5–5.2)
Alkaline Phosphatase: 79 U/L (ref 39–117)
BUN: 13 mg/dL (ref 6–23)
CO2: 28 meq/L (ref 19–32)
Calcium: 9.7 mg/dL (ref 8.4–10.5)
Chloride: 103 meq/L (ref 96–112)
Creatinine, Ser: 0.79 mg/dL (ref 0.40–1.20)
GFR: 75.6 mL/min
Glucose, Bld: 103 mg/dL — ABNORMAL HIGH (ref 70–99)
Potassium: 4.2 meq/L (ref 3.5–5.1)
Sodium: 139 meq/L (ref 135–145)
Total Bilirubin: 0.7 mg/dL (ref 0.2–1.2)
Total Protein: 7.3 g/dL (ref 6.0–8.3)

## 2024-02-21 LAB — LIPID PANEL
Cholesterol: 186 mg/dL (ref 28–200)
HDL: 71.8 mg/dL
LDL Cholesterol: 87 mg/dL (ref 10–99)
NonHDL: 113.94
Total CHOL/HDL Ratio: 3
Triglycerides: 133 mg/dL (ref 10.0–149.0)
VLDL: 26.6 mg/dL (ref 0.0–40.0)

## 2024-02-27 ENCOUNTER — Other Ambulatory Visit
